# Patient Record
Sex: Male | Born: 1955
Health system: Southern US, Community
[De-identification: ages and names within clinical notes are randomized; demographics above are authoritative.]

## PROBLEM LIST (undated history)

## (undated) DIAGNOSIS — N189 Chronic kidney disease, unspecified: Secondary | ICD-10-CM

## (undated) DIAGNOSIS — J309 Allergic rhinitis, unspecified: Secondary | ICD-10-CM

## (undated) DIAGNOSIS — L708 Other acne: Secondary | ICD-10-CM

## (undated) DIAGNOSIS — Z87442 Personal history of urinary calculi: Secondary | ICD-10-CM

## (undated) DIAGNOSIS — I10 Essential (primary) hypertension: Secondary | ICD-10-CM

## (undated) DIAGNOSIS — Z86010 Personal history of colonic polyps: Secondary | ICD-10-CM

## (undated) DIAGNOSIS — E785 Hyperlipidemia, unspecified: Secondary | ICD-10-CM

## (undated) DIAGNOSIS — F528 Other sexual dysfunction not due to a substance or known physiological condition: Secondary | ICD-10-CM

## (undated) DIAGNOSIS — I4892 Unspecified atrial flutter: Secondary | ICD-10-CM

## (undated) DIAGNOSIS — R972 Elevated prostate specific antigen [PSA]: Secondary | ICD-10-CM

## (undated) HISTORY — DX: Other sexual dysfunction not due to a substance or known physiological condition: F52.8

## (undated) HISTORY — PX: POLYPECTOMY: SHX149

## (undated) HISTORY — PX: TONSILLECTOMY: SUR1361

## (undated) HISTORY — PX: COLONOSCOPY: SHX174

## (undated) HISTORY — DX: Allergic rhinitis, unspecified: J30.9

## (undated) HISTORY — DX: Essential (primary) hypertension: I10

## (undated) HISTORY — DX: Elevated prostate specific antigen (PSA): R97.20

## (undated) HISTORY — DX: Unspecified atrial flutter: I48.92

## (undated) HISTORY — DX: Other acne: L70.8

## (undated) HISTORY — DX: Personal history of colonic polyps: Z86.010

## (undated) HISTORY — DX: Hyperlipidemia, unspecified: E78.5

## (undated) HISTORY — DX: Personal history of urinary calculi: Z87.442

## (undated) HISTORY — DX: Chronic kidney disease, unspecified: N18.9

---

## 2003-07-26 ENCOUNTER — Emergency Department (HOSPITAL_COMMUNITY): Admission: EM | Admit: 2003-07-26 | Discharge: 2003-07-26 | Payer: Self-pay | Admitting: Emergency Medicine

## 2004-06-14 IMAGING — CT CT PELVIS W/O CM
1 of 3 series · 15 of 32 positions shown, 19 images · non-contrast
Comparison: none

CLINICAL DATA: Right lower abdominal and pelvic pain.  Nausea.  Previous history of renal calculi.
TECHNIQUE: Multidetector helical CT of the abdomen and pelvis was performed following the renal stone protocol.  No oral or intravenous contrast was administered.  There are no prior studies for comparison.
CT ABDOMEN WITHOUT CONTRAST
Multiple small renal calculi are seen in both the kidneys.  A fluid density cyst is also seen in the posterior mid pole of the right kidney measuring approximately 3 cm in greatest diameter.  Mild right sided pelvicalyectasis is seen as well as proximal ureterectasis.  There is no evidence of left sided pelvicalyectasis.  
The visualized portions of the upper abdominal parenchymal organs are unremarkable on this non contrast study.  There is no evidence of dilated bowel loops.  No inflammatory process or abnormal fluid collections are seen.  
IMPRESSION
1.  Mild right hydronephrosis.  See pelvis CT report below.
2.  Bilateral intrarenal calculi.  Small right renal cyst.
CT PELVIS WITHOUT CONTRAST
Right ureterectasis is seen.  Calculus is seen in the region of the right ureterovesical junction measuring 4 x 5 mm which may have already passed into the urinary bladder.  There is no evidence of left-sided ureteral calculi or dilatation.  
No pelvic masses are identified.  There is no evidence of inflammatory process or abnormal fluid collections of the pelvis.  There is no evidence of dilated bowel loops.  
4 x 5 mm calculus in the region of the right ureterovesical junction, which may have already passed into the urinary bladder.  Mild right hydronephrosis and ureterectasis.

[Series 3: kidney sto 5.0 b30f · axial · 0.66mm/px · z∈[+764,+1100]mm · 15 of 96 slices shown, 19 images]
[im 8/96  soft-tissue]
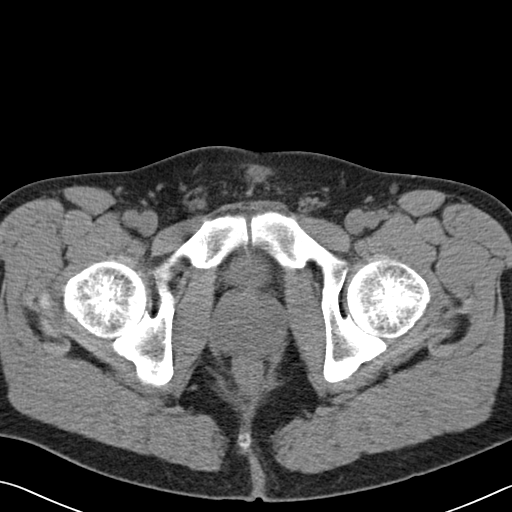
[im 8/96  bone]
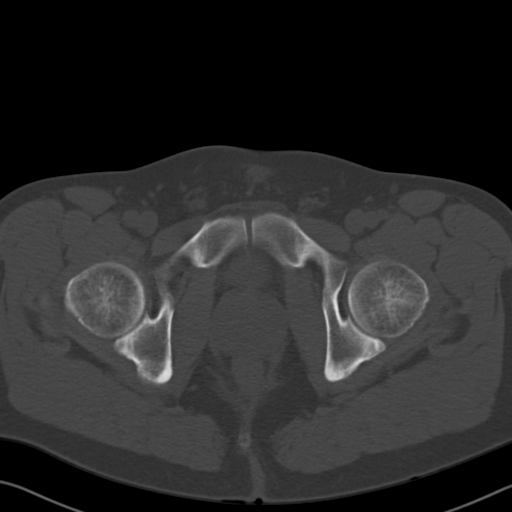
[im 15/96  soft-tissue]
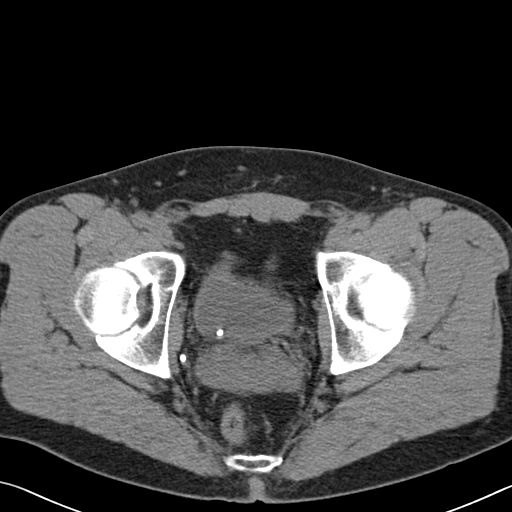
[im 22/96  soft-tissue]
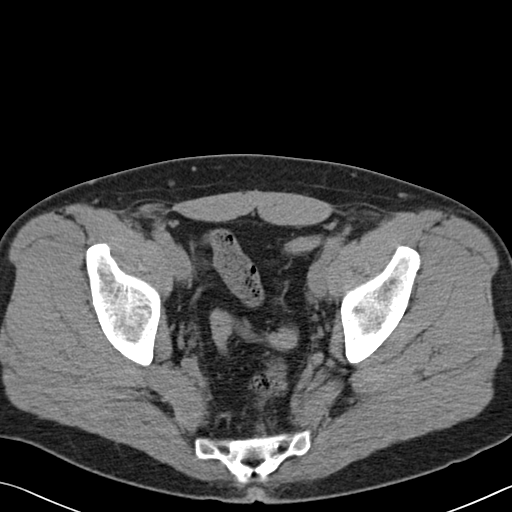
[im 29/96  soft-tissue]
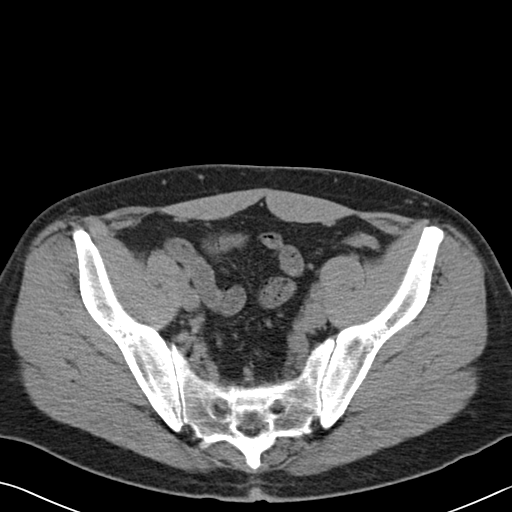
[im 36/96  soft-tissue]
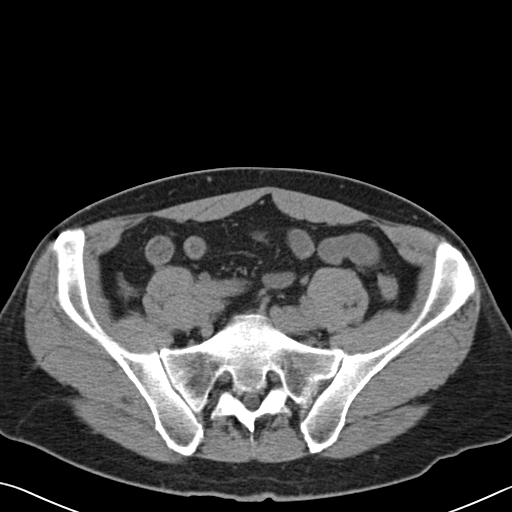
[im 43/96  soft-tissue]
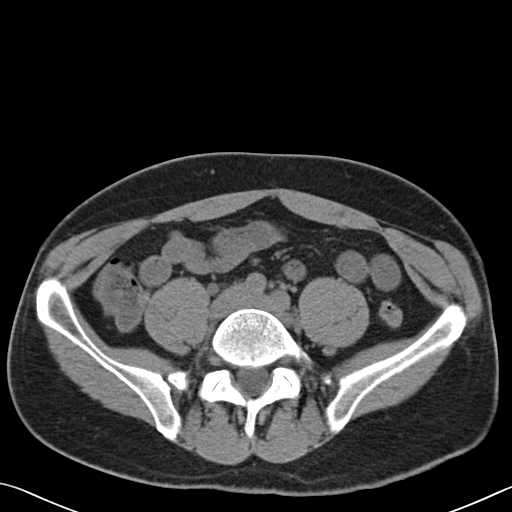
[im 50/96  soft-tissue]
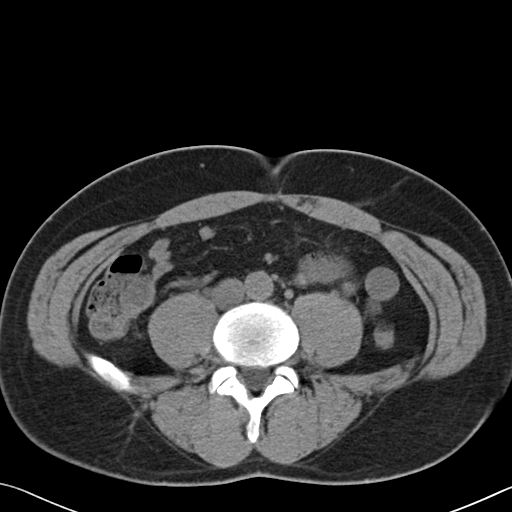
[im 57/96  soft-tissue]
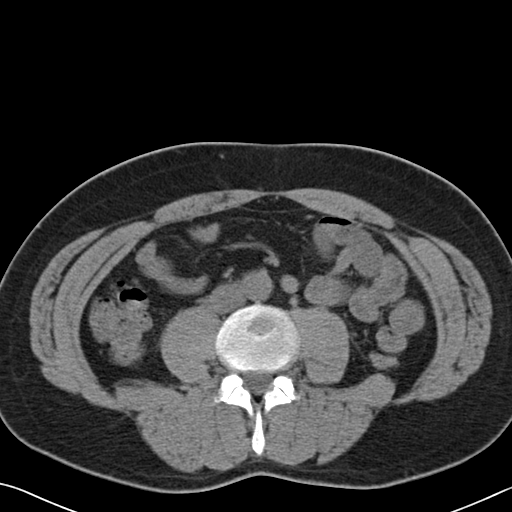
[im 64/96  soft-tissue]
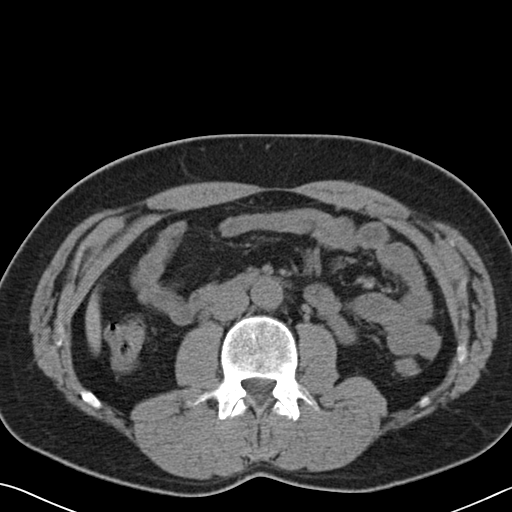
[im 64/96  bone]
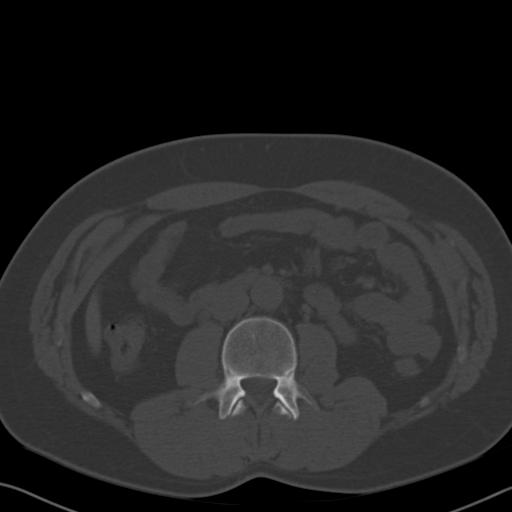
[im 71/96  soft-tissue]
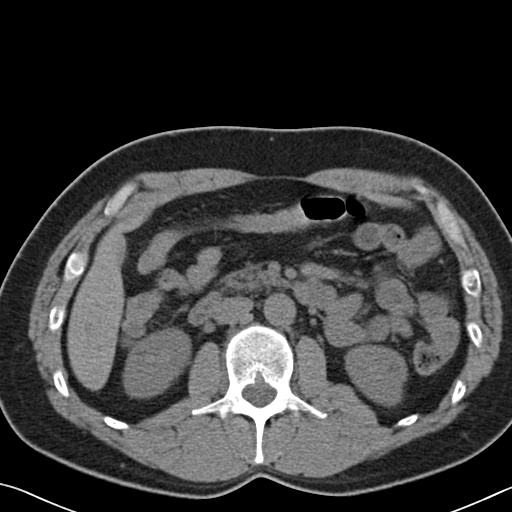
[im 78/96  soft-tissue]
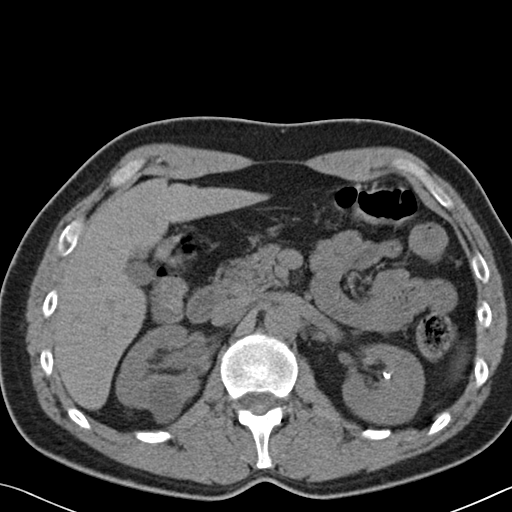
[im 81/96  lung]
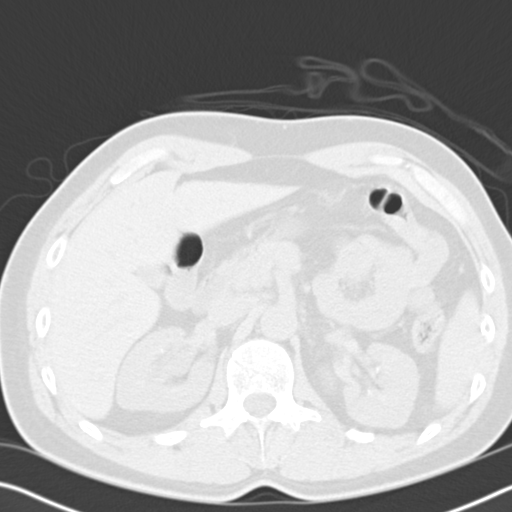
[im 85/96  soft-tissue]
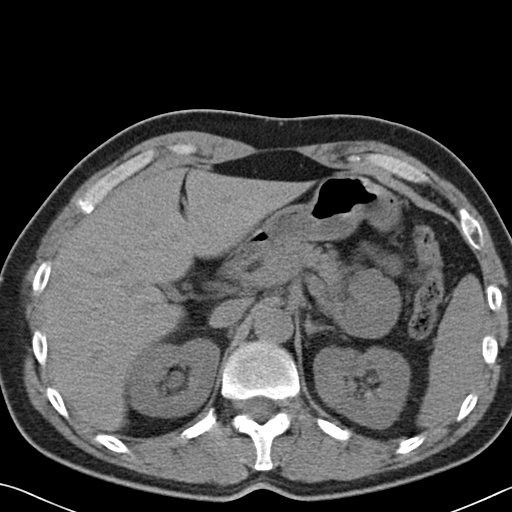
[im 85/96  lung]
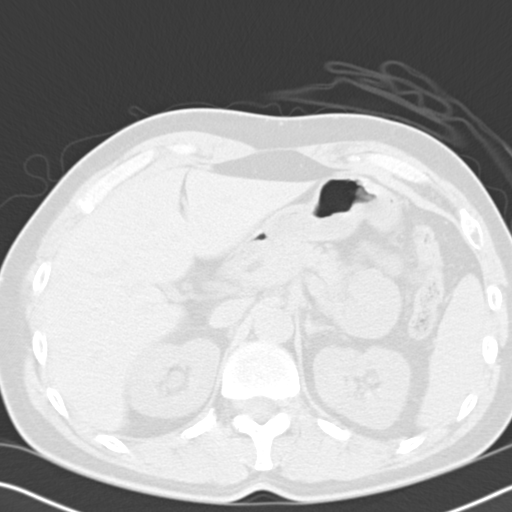
[im 88/96  lung]
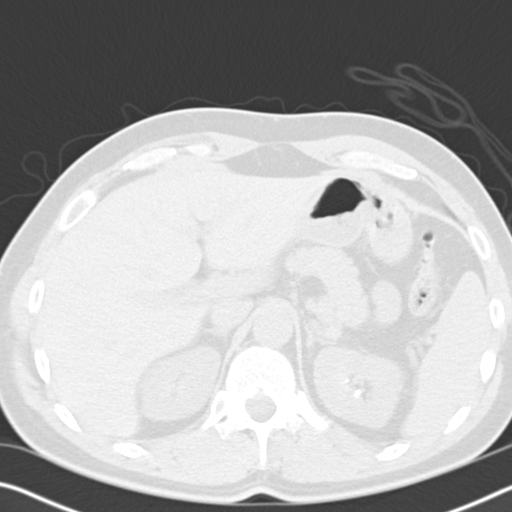
[im 92/96  soft-tissue]
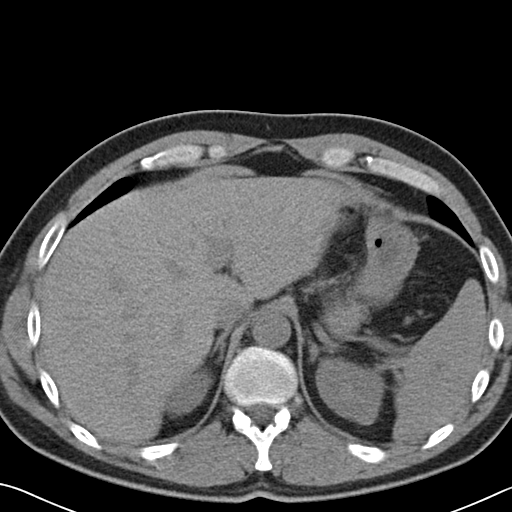
[im 92/96  lung]
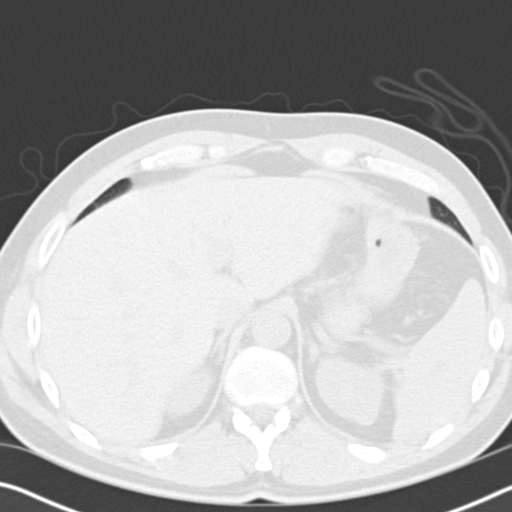

[15 of 32 positions shown; findings below may reference images not displayed]

## 2006-04-04 ENCOUNTER — Ambulatory Visit: Payer: Self-pay | Admitting: Internal Medicine

## 2006-04-09 ENCOUNTER — Ambulatory Visit: Payer: Self-pay | Admitting: Internal Medicine

## 2007-03-03 ENCOUNTER — Ambulatory Visit: Payer: Self-pay | Admitting: Internal Medicine

## 2007-03-03 LAB — CONVERTED CEMR LAB
ALT: 21 units/L (ref 0–53)
AST: 25 units/L (ref 0–37)
Albumin: 4.1 g/dL (ref 3.5–5.2)
Alkaline Phosphatase: 56 units/L (ref 39–117)
BUN: 18 mg/dL (ref 6–23)
Basophils Absolute: 0 10*3/uL (ref 0.0–0.1)
Basophils Relative: 0.3 % (ref 0.0–1.0)
Bilirubin Urine: NEGATIVE
Bilirubin, Direct: 0.2 mg/dL (ref 0.0–0.3)
CO2: 31 meq/L (ref 19–32)
Calcium: 9.1 mg/dL (ref 8.4–10.5)
Chloride: 108 meq/L (ref 96–112)
Cholesterol: 214 mg/dL (ref 0–200)
Creatinine, Ser: 1.2 mg/dL (ref 0.4–1.5)
Direct LDL: 154.1 mg/dL
Eosinophils Absolute: 0.2 10*3/uL (ref 0.0–0.6)
Eosinophils Relative: 2.7 % (ref 0.0–5.0)
GFR calc Af Amer: 82 mL/min
GFR calc non Af Amer: 68 mL/min
Glucose, Bld: 105 mg/dL — ABNORMAL HIGH (ref 70–99)
HCT: 44.1 % (ref 39.0–52.0)
HDL: 31.4 mg/dL — ABNORMAL LOW (ref >39.0)
Hemoglobin, Urine: NEGATIVE
Hemoglobin: 15.3 g/dL (ref 13.0–17.0)
Ketones, ur: NEGATIVE mg/dL
Leukocytes, UA: NEGATIVE
Lymphocytes Relative: 30.3 % (ref 12.0–46.0)
MCHC: 34.6 g/dL (ref 30.0–36.0)
MCV: 91.9 fL (ref 78.0–100.0)
Monocytes Absolute: 0.5 10*3/uL (ref 0.2–0.7)
Monocytes Relative: 9.8 % (ref 3.0–11.0)
Neutro Abs: 3.2 10*3/uL (ref 1.4–7.7)
Neutrophils Relative %: 56.9 % (ref 43.0–77.0)
Nitrite: NEGATIVE
PSA: 1.36 ng/mL (ref 0.10–4.00)
Platelets: 203 10*3/uL (ref 150–400)
Potassium: 4.4 meq/L (ref 3.5–5.1)
RBC: 4.79 M/uL (ref 4.22–5.81)
RDW: 12 % (ref 11.5–14.6)
Sodium: 143 meq/L (ref 135–145)
Specific Gravity, Urine: 1.025 (ref 1.000–1.03)
TSH: 1.35 microintl units/mL (ref 0.35–5.50)
Testosterone: 383.48 ng/dL (ref 350.00–890)
Total Bilirubin: 1.4 mg/dL — ABNORMAL HIGH (ref 0.3–1.2)
Total CHOL/HDL Ratio: 6.8
Total Protein, Urine: NEGATIVE mg/dL
Total Protein: 7 g/dL (ref 6.0–8.3)
Triglycerides: 99 mg/dL (ref 0–149)
Urine Glucose: NEGATIVE mg/dL
Urobilinogen, UA: 0.2 (ref 0.0–1.0)
VLDL: 20 mg/dL (ref 0–40)
WBC: 5.6 10*3/uL (ref 4.5–10.5)
pH: 6 (ref 5.0–8.0)

## 2007-03-09 ENCOUNTER — Ambulatory Visit: Payer: Self-pay | Admitting: Internal Medicine

## 2007-05-22 ENCOUNTER — Encounter: Payer: Self-pay | Admitting: Internal Medicine

## 2007-09-18 ENCOUNTER — Ambulatory Visit: Payer: Self-pay | Admitting: Gastroenterology

## 2007-10-02 ENCOUNTER — Encounter: Payer: Self-pay | Admitting: Internal Medicine

## 2007-10-02 ENCOUNTER — Encounter: Payer: Self-pay | Admitting: Gastroenterology

## 2007-10-02 ENCOUNTER — Ambulatory Visit: Payer: Self-pay | Admitting: Gastroenterology

## 2007-10-02 LAB — HM COLONOSCOPY

## 2008-06-16 ENCOUNTER — Telehealth: Payer: Self-pay | Admitting: Internal Medicine

## 2008-07-22 ENCOUNTER — Ambulatory Visit: Payer: Self-pay | Admitting: Internal Medicine

## 2008-07-22 LAB — CONVERTED CEMR LAB
ALT: 28 units/L (ref 0–53)
AST: 23 units/L (ref 0–37)
Albumin: 4.1 g/dL (ref 3.5–5.2)
Alkaline Phosphatase: 63 units/L (ref 39–117)
BUN: 22 mg/dL (ref 6–23)
Basophils Absolute: 0 10*3/uL (ref 0.0–0.1)
Basophils Relative: 0.7 % (ref 0.0–3.0)
Bilirubin Urine: NEGATIVE
Bilirubin, Direct: 0.1 mg/dL (ref 0.0–0.3)
CO2: 33 meq/L — ABNORMAL HIGH (ref 19–32)
Calcium: 9.3 mg/dL (ref 8.4–10.5)
Chloride: 107 meq/L (ref 96–112)
Cholesterol: 237 mg/dL (ref 0–200)
Creatinine, Ser: 1.1 mg/dL (ref 0.4–1.5)
Direct LDL: 167 mg/dL
Eosinophils Absolute: 0.1 10*3/uL (ref 0.0–0.7)
Eosinophils Relative: 2.9 % (ref 0.0–5.0)
GFR calc Af Amer: 91 mL/min
GFR calc non Af Amer: 75 mL/min
Glucose, Bld: 97 mg/dL (ref 70–99)
HCT: 43.7 % (ref 39.0–52.0)
HDL: 41.2 mg/dL (ref >39.0)
Hemoglobin, Urine: NEGATIVE
Hemoglobin: 15.4 g/dL (ref 13.0–17.0)
Ketones, ur: NEGATIVE mg/dL
Leukocytes, UA: NEGATIVE
Lymphocytes Relative: 28.2 % (ref 12.0–46.0)
MCHC: 35.2 g/dL (ref 30.0–36.0)
MCV: 93.8 fL (ref 78.0–100.0)
Monocytes Absolute: 0.5 10*3/uL (ref 0.1–1.0)
Monocytes Relative: 10.1 % (ref 3.0–12.0)
Neutro Abs: 2.8 10*3/uL (ref 1.4–7.7)
Neutrophils Relative %: 58.1 % (ref 43.0–77.0)
Nitrite: NEGATIVE
PSA: 1.17 ng/mL (ref 0.10–4.00)
Platelets: 166 10*3/uL (ref 150–400)
Potassium: 4.8 meq/L (ref 3.5–5.1)
RBC: 4.66 M/uL (ref 4.22–5.81)
RDW: 11.9 % (ref 11.5–14.6)
Sodium: 143 meq/L (ref 135–145)
Specific Gravity, Urine: 1.025 (ref 1.000–1.03)
TSH: 1.3 microintl units/mL (ref 0.35–5.50)
Total Bilirubin: 1 mg/dL (ref 0.3–1.2)
Total CHOL/HDL Ratio: 5.8
Total Protein: 6.8 g/dL (ref 6.0–8.3)
Triglycerides: 101 mg/dL (ref 0–149)
Urine Glucose: NEGATIVE mg/dL
Urobilinogen, UA: 0.2 (ref 0.0–1.0)
VLDL: 20 mg/dL (ref 0–40)
WBC: 4.8 10*3/uL (ref 4.5–10.5)
pH: 5.5 (ref 5.0–8.0)

## 2008-07-29 ENCOUNTER — Ambulatory Visit: Payer: Self-pay | Admitting: Internal Medicine

## 2008-07-29 DIAGNOSIS — F528 Other sexual dysfunction not due to a substance or known physiological condition: Secondary | ICD-10-CM | POA: Insufficient documentation

## 2008-07-29 DIAGNOSIS — Z87442 Personal history of urinary calculi: Secondary | ICD-10-CM | POA: Insufficient documentation

## 2008-07-29 DIAGNOSIS — Z860100 Personal history of colon polyps, unspecified: Secondary | ICD-10-CM | POA: Insufficient documentation

## 2008-07-29 DIAGNOSIS — E785 Hyperlipidemia, unspecified: Secondary | ICD-10-CM

## 2008-07-29 DIAGNOSIS — R972 Elevated prostate specific antigen [PSA]: Secondary | ICD-10-CM | POA: Insufficient documentation

## 2008-07-29 DIAGNOSIS — L708 Other acne: Secondary | ICD-10-CM

## 2008-07-29 DIAGNOSIS — I10 Essential (primary) hypertension: Secondary | ICD-10-CM | POA: Insufficient documentation

## 2008-07-29 DIAGNOSIS — J309 Allergic rhinitis, unspecified: Secondary | ICD-10-CM | POA: Insufficient documentation

## 2008-07-29 HISTORY — DX: Personal history of urinary calculi: Z87.442

## 2008-07-29 HISTORY — DX: Essential (primary) hypertension: I10

## 2008-07-29 HISTORY — DX: Personal history of colonic polyps: Z86.010

## 2008-07-29 HISTORY — DX: Allergic rhinitis, unspecified: J30.9

## 2008-07-29 HISTORY — DX: Other acne: L70.8

## 2008-07-29 HISTORY — DX: Elevated prostate specific antigen (PSA): R97.20

## 2008-07-29 HISTORY — DX: Hyperlipidemia, unspecified: E78.5

## 2008-07-29 HISTORY — DX: Other sexual dysfunction not due to a substance or known physiological condition: F52.8

## 2009-09-13 ENCOUNTER — Ambulatory Visit: Payer: Self-pay | Admitting: Internal Medicine

## 2009-09-13 LAB — CONVERTED CEMR LAB
ALT: 27 units/L (ref 0–53)
AST: 24 units/L (ref 0–37)
Albumin: 4 g/dL (ref 3.5–5.2)
Alkaline Phosphatase: 66 units/L (ref 39–117)
BUN: 18 mg/dL (ref 6–23)
Basophils Absolute: 0 10*3/uL (ref 0.0–0.1)
Basophils Relative: 0.6 % (ref 0.0–3.0)
Bilirubin Urine: NEGATIVE
Bilirubin, Direct: 0.2 mg/dL (ref 0.0–0.3)
CO2: 31 meq/L (ref 19–32)
Calcium: 9 mg/dL (ref 8.4–10.5)
Chloride: 106 meq/L (ref 96–112)
Cholesterol: 225 mg/dL — ABNORMAL HIGH (ref 0–200)
Creatinine, Ser: 1.2 mg/dL (ref 0.4–1.5)
Direct LDL: 164 mg/dL
Eosinophils Absolute: 0.1 10*3/uL (ref 0.0–0.7)
Eosinophils Relative: 2.7 % (ref 0.0–5.0)
GFR calc non Af Amer: 67.37 mL/min (ref >60)
Glucose, Bld: 78 mg/dL (ref 70–99)
HCT: 43.6 % (ref 39.0–52.0)
HDL: 46 mg/dL (ref >39.00)
Hemoglobin, Urine: NEGATIVE
Hemoglobin: 14.4 g/dL (ref 13.0–17.0)
Ketones, ur: NEGATIVE mg/dL
Leukocytes, UA: NEGATIVE
Lymphocytes Relative: 30 % (ref 12.0–46.0)
Lymphs Abs: 1.7 10*3/uL (ref 0.7–4.0)
MCHC: 32.9 g/dL (ref 30.0–36.0)
MCV: 95.8 fL (ref 78.0–100.0)
Monocytes Absolute: 0.7 10*3/uL (ref 0.1–1.0)
Monocytes Relative: 12.5 % — ABNORMAL HIGH (ref 3.0–12.0)
Neutro Abs: 3 10*3/uL (ref 1.4–7.7)
Neutrophils Relative %: 54.2 % (ref 43.0–77.0)
Nitrite: NEGATIVE
PSA: 1.17 ng/mL (ref 0.10–4.00)
Platelets: 178 10*3/uL (ref 150.0–400.0)
Potassium: 3.9 meq/L (ref 3.5–5.1)
RBC: 4.55 M/uL (ref 4.22–5.81)
RDW: 12.4 % (ref 11.5–14.6)
Sodium: 142 meq/L (ref 135–145)
Specific Gravity, Urine: >=1.03 (ref 1.000–1.030)
TSH: 1.41 microintl units/mL (ref 0.35–5.50)
Total Bilirubin: 1.1 mg/dL (ref 0.3–1.2)
Total CHOL/HDL Ratio: 5
Total Protein: 7.1 g/dL (ref 6.0–8.3)
Triglycerides: 83 mg/dL (ref 0.0–149.0)
Urine Glucose: NEGATIVE mg/dL
Urobilinogen, UA: 0.2 (ref 0.0–1.0)
VLDL: 16.6 mg/dL (ref 0.0–40.0)
WBC: 5.5 10*3/uL (ref 4.5–10.5)
pH: 5.5 (ref 5.0–8.0)

## 2009-09-18 ENCOUNTER — Ambulatory Visit: Payer: Self-pay | Admitting: Internal Medicine

## 2010-08-14 NOTE — Assessment & Plan Note (Signed)
Summary: PHYSICAL--STC   Vital Signs:  Patient profile:   55 year old male Height:      75 inches Weight:      208.75 pounds BMI:     26.19 O2 Sat:      96 % on Room air Temp:     98.3 degrees F oral Pulse rate:   64 / minute BP sitting:   132 / 80  (left arm) Cuff size:   regular  Vitals Entered ByZella Ball Ewing (September 18, 2009 10:49 AM)  O2 Flow:  Room air  CC: Adult Physical/RE   CC:  Adult Physical/RE.  History of Present Illness: overall doing well, but has some hesitancy with urination but mild overall and he minimizes, does not think significant overall at this time or need to try med such as flomax;  Pt denies CP, sob, doe, wheezing, orthopnea, pnd, worsening LE edema, palps, dizziness or syncope   Pt denies new neuro symptoms such as headache, facial or extremity weakness     Problems Prior to Update: 1)  Allergic Rhinitis  (ICD-477.9) 2)  Nephrolithiasis, Hx of  (ICD-V13.01) 3)  Erectile Dysfunction  (ICD-302.72) 4)  Psa, Increased  (ICD-790.93) 5)  Acne Nec  (ICD-706.1) 6)  Colonic Polyps, Hx of  (ICD-V12.72) 7)  Hypertension  (ICD-401.9) 8)  Hyperlipidemia  (ICD-272.4) 9)  Preventive Health Care  (ICD-V70.0)  Medications Prior to Update: 1)  Lotrel 5-20 Mg Caps (Amlodipine Besy-Benazepril Hcl) .Marland Kitchen.. 1 By Mouth Daily 2)  Adult Aspirin Ec Low Strength 81 Mg Tbec (Aspirin) .Marland Kitchen.. 1po Once Daily 3)  Cialis 20 Mg Tabs (Tadalafil) .Marland Kitchen.. 1 By Mouth Every Other Day  Current Medications (verified): 1)  Amlodipine Besy-Benazepril Hcl 5-20 Mg Caps (Amlodipine Besy-Benazepril Hcl) .Marland Kitchen.. 1po Once Daily 2)  Adult Aspirin Ec Low Strength 81 Mg Tbec (Aspirin) .Marland Kitchen.. 1po Once Daily  Allergies (verified): No Known Drug Allergies  Past History:  Past Medical History: Last updated: 2008/08/02 Hyperlipidemia Hypertension Colonic polyps, hx of - adenoma 3/09 PSA increased E.D. Nephrolithiasis, hx of Allergic rhinitis  Past Surgical History: Last updated:  2008-08-02 Tonsillectomy  Family History: Last updated: August 02, 2008 father died with CVA mother died with CVA and lung cancer  Social History: Last updated: 08-02-08 Never Smoked Alcohol use-yes Married 3 children work - Art therapist - [employer]  Risk Factors: Smoking Status: never (2008/08/02)  Review of Systems  The patient denies anorexia, fever, vision loss, decreased hearing, hoarseness, chest pain, syncope, dyspnea on exertion, peripheral edema, prolonged cough, headaches, hemoptysis, abdominal pain, melena, hematochezia, severe indigestion/heartburn, hematuria, incontinence, muscle weakness, suspicious skin lesions, difficulty walking, depression, unusual weight change, abnormal bleeding, enlarged lymph nodes, and angioedema.         all otherwise negative per pt -    Physical Exam  General:  alert and well-developed.   Head:  normocephalic and atraumatic.   Eyes:  vision grossly intact, pupils equal, and pupils round.   Ears:  R ear normal and L ear normal.   Nose:  no external deformity and no nasal discharge.   Mouth:  no gingival abnormalities and pharynx pink and moist.   Neck:  supple and no masses.   Lungs:  normal respiratory effort and normal breath sounds.   Heart:  normal rate and regular rhythm.   Abdomen:  soft, non-tender, and normal bowel sounds.   Msk:  no joint tenderness and no joint swelling.   Extremities:  no edema, no erythema  Neurologic:  cranial  nerves II-XII intact and strength normal in all extremities.   Skin:  color normal and no rashes.   Psych:  not anxious appearing and not depressed appearing.     Impression & Recommendations:  Problem # 1:  Preventive Health Care (ICD-V70.0) Overall doing well, age appropriate education and counseling updated and referral for appropriate preventive services done unless declined, immunizations up to date or declined, diet counseling done if overweight, urged to quit smoking if smokes  , most recent labs reviewed and current ordered if appropriate, ecg reviewed or declined (interpretation per ECG scanned in the EMR if done); information regarding Medicare Prevention requirements given if appropriate   Problem # 2:  HYPERLIPIDEMIA (ICD-272.4)  Labs Reviewed: SGOT: 24 (09/13/2009)   SGPT: 27 (09/13/2009)   HDL:46.00 (09/13/2009), 41.2 (07/22/2008)  LDL:DEL (07/22/2008), DEL (03/03/2007)  Chol:225 (09/13/2009), 237 (07/22/2008)  Trig:83.0 (09/13/2009), 101 (07/22/2008) d/w pt - to cont diet for now, consider statin but wants to re-check lipids 6 mo  Complete Medication List: 1)  Amlodipine Besy-benazepril Hcl 5-20 Mg Caps (Amlodipine besy-benazepril hcl) .Marland Kitchen.. 1po once daily 2)  Adult Aspirin Ec Low Strength 81 Mg Tbec (Aspirin) .Marland Kitchen.. 1po once daily  Other Orders: Tdap => 53yrs IM (47829) Admin 1st Vaccine (56213)  Patient Instructions: 1)  please return in 6 months for LAB only:  Lipids 272.0 2)  please call the number on the blue card for results 3)  you had the tetanus shot today 4)  Continue all previous medications as before this visit  5)  Please schedule a follow-up appointment in 1 year or sooner if needed Prescriptions: AMLODIPINE BESY-BENAZEPRIL HCL 5-20 MG CAPS (AMLODIPINE BESY-BENAZEPRIL HCL) 1po once daily  #90 x 3   Entered and Authorized by:   Corwin Levins MD   Signed by:   Corwin Levins MD on 09/18/2009   Method used:   Faxed to ...       [facility])             , [place]         Ph:        Fax: [phone]   RxID:   [id number]    Immunizations Administered:  Tetanus Vaccine:    Vaccine Type: Tdap    Site: left deltoid    Mfr: GlaxoSmithKline    Dose: 0.5 ml    Route: IM    Given by: Robin Ewing    Exp. Date: 05/10/2010    Lot #: [id number]    VIS given: 06/02/07 version given September 18, 2009.

## 2010-08-14 NOTE — Procedures (Signed)
Summary: Referral Form  Referral Form   Imported By: Maryln Gottron 05/25/2007 14:46:21  _____________________________________________________________________  External Attachment:    Type:   Image     Comment:   External Document

## 2010-08-14 NOTE — Procedures (Signed)
Summary: Colonoscopy Report/[facility]  Colonoscopy Report/[facility]   Imported By: Esmeralda Links D'jimraou 10/09/2007 12:30:52  _____________________________________________________________________  External Attachment:    Type:   Image     Comment:   External Document

## 2010-08-14 NOTE — Assessment & Plan Note (Signed)
Summary: PER PT PHYSICAL-$50--STC   Vital Signs:  Patient Profile:   55 Years Old Male Weight:      207 pounds O2 Sat:      97 % O2 treatment:    Room Air Temp:     97.0 degrees F oral Pulse rate:   89 / minute BP sitting:   116 / 80  (left arm) Cuff size:   regular  Vitals Entered By: Payton Spark CMA (July 29, 2008 1:07 PM)                 Preventive Care Screening  Colonoscopy:    Date:  10/02/2007    Next Due:  10/2012    Results:  Adenomatous Polyp    Chief Complaint:  physical.  History of Present Illness: overall doing well, has some off and on trouble with urinating very mild at best without dysuria or fever    Prior Medications Reviewed Using: Patient Recall  Updated Prior Medication List: LOTREL 5-20 MG CAPS (AMLODIPINE BESY-BENAZEPRIL HCL) 1 by mouth daily ADULT ASPIRIN EC LOW STRENGTH 81 MG TBEC (ASPIRIN) 1po once daily CIALIS 20 MG TABS (TADALAFIL) 1 by mouth every other day  Current Allergies (reviewed today): No known allergies   Past Medical History:    Reviewed history and no changes required:       Hyperlipidemia       Hypertension       Colonic polyps, hx of - adenoma 3/09       PSA increased       E.D.       Nephrolithiasis, hx of       Allergic rhinitis  Past Surgical History:    Reviewed history and no changes required:       Tonsillectomy   Family History:    Reviewed history and no changes required:       father died with CVA       mother died with CVA and lung cancer  Social History:    Reviewed history and no changes required:       Never Smoked       Alcohol use-yes       Married       3 children       work - Art therapist - local [employer]   Risk Factors:  Tobacco use:  never Alcohol use:  yes  Colonoscopy History:     Date of Last Colonoscopy:  10/02/2007    Results:  Adenomatous Polyp    Review of Systems  The patient denies anorexia, fever, weight loss, weight gain, vision loss, decreased  hearing, hoarseness, chest pain, syncope, dyspnea on exertion, peripheral edema, prolonged cough, headaches, hemoptysis, abdominal pain, melena, hematochezia, severe indigestion/heartburn, hematuria, incontinence, genital sores, muscle weakness, suspicious skin lesions, transient blindness, difficulty walking, depression, unusual weight change, abnormal bleeding, enlarged lymph nodes, angioedema, breast masses, and testicular masses.         all otherwise negative except would like to try another med for ED besides viagra - seems to cause heartburn   Physical Exam  General:     alert and well-developed.   Head:     Normocephalic and atraumatic without obvious abnormalities. No apparent alopecia or balding. Eyes:     No corneal or conjunctival inflammation noted. EOMI. Perrla. Ears:     R ear normal and L ear normal.   Nose:     nasal dischargemucosal pallor and mucosal erythema.   Mouth:  pharyngeal erythema and fair dentition.   Neck:     supple and full ROM.   Lungs:     Normal respiratory effort, chest expands symmetrically. Lungs are clear to auscultation, no crackles or wheezes. Heart:     Normal rate and regular rhythm. S1 and S2 normal without gallop, murmur, click, rub or other extra sounds. Abdomen:     Bowel sounds positive,abdomen soft and non-tender without masses, organomegaly or hernias noted. Rectal:     deferred Msk:     no joint tenderness and no joint swelling.   Extremities:     no edema, no ulcers  Neurologic:     cranial nerves II-XII intact and strength normal in all extremities.      Impression & Recommendations:  Problem # 1:  Preventive Health Care (ICD-V70.0) Overall doing well, up to date, counseled on routine health concerns for screening and prevention, immunizations up to date or declined, labs reviewed, ecg reviewed    Orders: EKG w/ Interpretation (93000)   Problem # 2:  HYPERTENSION (ICD-401.9)  His updated medication list for this  problem includes:    Lotrel 5-20 Mg Caps (Amlodipine besy-benazepril hcl) .Marland Kitchen... 1 by mouth daily  BP today: 116/80  Labs Reviewed: Creat: 1.1 (07/22/2008) Chol: 237 (07/22/2008)   HDL: 41.2 (07/22/2008)   LDL: 167.0 (07/22/2008)   TG: 101 (07/22/2008) stable overall by hx and exam, ok to continue meds/tx as is   Problem # 3:  HYPERLIPIDEMIA (ICD-272.4)  Labs Reviewed: Chol: 237 (07/22/2008)   HDL: 41.2 (07/22/2008)   LDL: 167.0 (07/22/2008)   TG: 101 (07/22/2008) SGOT: 23 (07/22/2008)   SGPT: 28 (07/22/2008) declines statin tx despite ldl goal < 100  Problem # 4:  ERECTILE DYSFUNCTION (ICD-302.72)  His updated medication list for this problem includes:    Cialis 20 Mg Tabs (Tadalafil) .Marland Kitchen... 1 by mouth every other day treat as above, f/u any worsening signs or symptoms   Complete Medication List: 1)  Lotrel 5-20 Mg Caps (Amlodipine besy-benazepril hcl) .Marland Kitchen.. 1 by mouth daily 2)  Adult Aspirin Ec Low Strength 81 Mg Tbec (Aspirin) .Marland Kitchen.. 1po once daily 3)  Cialis 20 Mg Tabs (Tadalafil) .Marland Kitchen.. 1 by mouth every other day   Patient Instructions: 1)  please follow strict lower cholesterol diet 2)  you received the tetanus today 3)  use the cialis as needed 4)  Continue all medications that you may have been taking previously  5)  Please schedule a follow-up appointment in 1 year or sooner if needed   Prescriptions: CIALIS 20 MG TABS (TADALAFIL) 1 by mouth every other day  #5 x 11   Entered and Authorized by:   Corwin Levins MD   Signed by:   Corwin Levins MD on 07/29/2008   Method used:   Print then Give to Patient   RxID:   [id number] CIALIS 20 MG TABS (TADALAFIL) 1 by mouth every other day  #3 x 0   Entered and Authorized by:   Corwin Levins MD   Signed by:   Corwin Levins MD on 07/29/2008   Method used:   Print then Give to Patient   RxID:   [id number] LOTREL 5-20 MG CAPS (AMLODIPINE BESY-BENAZEPRIL HCL) 1 by mouth daily  #90 x 3   Entered and Authorized by:   Corwin Levins MD   Signed by:   Corwin Levins MD on 07/29/2008   Method used:   Print then Give to Patient  RxID:   [id number]

## 2010-10-09 ENCOUNTER — Other Ambulatory Visit (INDEPENDENT_AMBULATORY_CARE_PROVIDER_SITE_OTHER): Payer: Managed Care, Other (non HMO)

## 2010-10-09 DIAGNOSIS — E785 Hyperlipidemia, unspecified: Secondary | ICD-10-CM

## 2010-10-09 DIAGNOSIS — Z0000 Encounter for general adult medical examination without abnormal findings: Secondary | ICD-10-CM

## 2010-10-09 DIAGNOSIS — Z0389 Encounter for observation for other suspected diseases and conditions ruled out: Secondary | ICD-10-CM

## 2010-10-09 LAB — URINALYSIS, ROUTINE W REFLEX MICROSCOPIC
Bilirubin Urine: NEGATIVE
Ketones, ur: NEGATIVE
Leukocytes, UA: NEGATIVE
Nitrite: NEGATIVE
Specific Gravity, Urine: 1.025 (ref 1.000–1.030)
Total Protein, Urine: NEGATIVE
Urine Glucose: NEGATIVE
Urobilinogen, UA: 0.2 (ref 0.0–1.0)
pH: 5.5 (ref 5.0–8.0)

## 2010-10-09 LAB — BASIC METABOLIC PANEL
BUN: 22 mg/dL (ref 6–23)
CO2: 30 mEq/L (ref 19–32)
Calcium: 8.9 mg/dL (ref 8.4–10.5)
Chloride: 107 mEq/L (ref 96–112)
Creatinine, Ser: 1.1 mg/dL (ref 0.4–1.5)
GFR: 73.41 mL/min (ref >60.00)
Glucose, Bld: 82 mg/dL (ref 70–99)
Potassium: 4.2 mEq/L (ref 3.5–5.1)
Sodium: 142 mEq/L (ref 135–145)

## 2010-10-09 LAB — CBC WITH DIFFERENTIAL/PLATELET
Basophils Absolute: 0 10*3/uL (ref 0.0–0.1)
Basophils Relative: 0.5 % (ref 0.0–3.0)
Eosinophils Absolute: 0.1 10*3/uL (ref 0.0–0.7)
Eosinophils Relative: 1.7 % (ref 0.0–5.0)
HCT: 41.6 % (ref 39.0–52.0)
Hemoglobin: 14 g/dL (ref 13.0–17.0)
Lymphocytes Relative: 24.9 % (ref 12.0–46.0)
Lymphs Abs: 1.3 10*3/uL (ref 0.7–4.0)
MCHC: 33.6 g/dL (ref 30.0–36.0)
MCV: 94.6 fl (ref 78.0–100.0)
Monocytes Absolute: 0.5 10*3/uL (ref 0.1–1.0)
Monocytes Relative: 9.8 % (ref 3.0–12.0)
Neutro Abs: 3.3 10*3/uL (ref 1.4–7.7)
Neutrophils Relative %: 63.1 % (ref 43.0–77.0)
Platelets: 184 10*3/uL (ref 150.0–400.0)
RBC: 4.4 Mil/uL (ref 4.22–5.81)
RDW: 13 % (ref 11.5–14.6)
WBC: 5.2 10*3/uL (ref 4.5–10.5)

## 2010-10-09 LAB — HEPATIC FUNCTION PANEL
ALT: 20 U/L (ref 0–53)
AST: 23 U/L (ref 0–37)
Albumin: 3.9 g/dL (ref 3.5–5.2)
Alkaline Phosphatase: 65 U/L (ref 39–117)
Bilirubin, Direct: 0.1 mg/dL (ref 0.0–0.3)
Total Bilirubin: 0.9 mg/dL (ref 0.3–1.2)
Total Protein: 6.5 g/dL (ref 6.0–8.3)

## 2010-10-09 LAB — LIPID PANEL
Cholesterol: 206 mg/dL — ABNORMAL HIGH (ref 0–200)
HDL: 38.3 mg/dL — ABNORMAL LOW (ref >39.00)
Total CHOL/HDL Ratio: 5
Triglycerides: 111 mg/dL (ref 0.0–149.0)
VLDL: 22.2 mg/dL (ref 0.0–40.0)

## 2010-10-09 LAB — TSH: TSH: 1.56 u[IU]/mL (ref 0.35–5.50)

## 2010-10-09 LAB — LDL CHOLESTEROL, DIRECT: Direct LDL: 144.7 mg/dL

## 2010-10-09 LAB — PSA: PSA: 1.13 ng/mL (ref 0.10–4.00)

## 2010-10-15 ENCOUNTER — Ambulatory Visit (INDEPENDENT_AMBULATORY_CARE_PROVIDER_SITE_OTHER): Payer: Managed Care, Other (non HMO) | Admitting: Internal Medicine

## 2010-10-15 ENCOUNTER — Encounter: Payer: Self-pay | Admitting: Internal Medicine

## 2010-10-15 VITALS — BP 124/78 | HR 67 | Temp 98.2°F | Ht 75.0 in | Wt 207.5 lb

## 2010-10-15 DIAGNOSIS — F528 Other sexual dysfunction not due to a substance or known physiological condition: Secondary | ICD-10-CM

## 2010-10-15 DIAGNOSIS — Z79899 Other long term (current) drug therapy: Secondary | ICD-10-CM

## 2010-10-15 DIAGNOSIS — E785 Hyperlipidemia, unspecified: Secondary | ICD-10-CM

## 2010-10-15 DIAGNOSIS — Z0000 Encounter for general adult medical examination without abnormal findings: Secondary | ICD-10-CM

## 2010-10-15 DIAGNOSIS — I10 Essential (primary) hypertension: Secondary | ICD-10-CM

## 2010-10-15 MED ORDER — ATORVASTATIN CALCIUM 10 MG PO TABS: 10.0000 mg | ORAL_TABLET | Freq: Every day | ORAL | Status: DC

## 2010-10-15 MED ORDER — VARDENAFIL HCL 20 MG PO TABS: 20.0000 mg | ORAL_TABLET | ORAL | Status: DC | PRN

## 2010-10-15 MED ORDER — AMLODIPINE BESY-BENAZEPRIL HCL 5-20 MG PO CAPS: 1.0000 | ORAL_CAPSULE | Freq: Every day | ORAL | Status: DC

## 2010-10-15 NOTE — Assessment & Plan Note (Signed)
For lft's with next lab visit

## 2010-10-15 NOTE — Assessment & Plan Note (Addendum)
Uncontrolled - to start the lipitor 10 mg, with f/u labs at 4 wks - last LDL 144, with goal ldl < 100

## 2010-10-15 NOTE — Patient Instructions (Signed)
Start the lipitor at 10 mg per day (sent to [facility]) Continue all other medications as before Please return for LAB only in 4 wks for Liver tests, and Lipids Please call the number on the Blue Card (the [employer] [facility]) for results of testing in 2-3 days after that Please call if you need a prescription to [facility] for the levitra or cialis

## 2010-10-15 NOTE — Assessment & Plan Note (Signed)
viagra with HA and back pain - gave 3 sample cialis, and rx for lesser expensive levitra 20 mg - pt to call if needs refills

## 2010-10-15 NOTE — Progress Notes (Signed)
Subjective:    Patient ID: Mathew Sanchez, male    DOB: [birth year], 55 y.o.   MRN: [id number]  HPI  Here for wellness and f/u;  Overall doing ok;  Pt denies CP, worsening SOB, DOE, wheezing, orthopnea, PND, worsening LE edema, palpitations, dizziness or syncope.  Pt denies neurological change such as new Headache, facial or extremity weakness.  Pt denies polydipsia, polyuria, or low sugar symptoms. Pt states overall good compliance with treatment and medications, good tolerability, and trying to follow lower cholesterol diet.  Pt denies worsening depressive symptoms, suicidal ideation or panic. No fever, wt loss, night sweats, loss of appetite, or other constitutional symptoms.  Pt states good ability with ADL's, low fall risk, home safety reviewed and adequate, no significant changes in hearing or vision, and occasionally active with exercise up to 3-4 times per wk.  Also menitons soreness and pain to the right retroauricular area upper aspect, sore to the touch, happened about 4 wks ago, lasted off adn on mild for about a wk, no fever, HA, ear pain, ST , cough.  Now resolved - ? msk strain Also mentions some minor urinary stream slowing but no other symptoms of prostatism at this time. Does have ongoing ED but viagra causea HA. Past Medical History  Diagnosis Date  . HYPERLIPIDEMIA 07/29/2008  . ERECTILE DYSFUNCTION 07/29/2008  . HYPERTENSION 07/29/2008  . ALLERGIC RHINITIS 07/29/2008  . ACNE NEC 07/29/2008  . PSA, INCREASED 07/29/2008  . COLONIC POLYPS, HX OF 07/29/2008  . NEPHROLITHIASIS, HX OF 07/29/2008   Past Surgical History  Procedure Date  . Tonsillectomy     reports that he has never smoked. He does not have any smokeless tobacco history on file. He reports that he drinks alcohol. His drug history not on file. family history is not on file. No Known Allergies Current Outpatient Prescriptions on File Prior to Visit  Medication Sig Dispense Refill  . aspirin 81 MG EC tablet Take 81 mg by  mouth daily.        Marland Kitchen DISCONTD: amLODipine-benazepril (LOTREL) 5-20 MG per capsule Take 1 capsule by mouth daily.         Review of Systems Review of Systems  Constitutional: Negative for diaphoresis, activity change, appetite change and unexpected weight change.  HENT: Negative for hearing loss, ear pain, facial swelling, mouth sores and neck stiffness.   Eyes: Negative for pain, redness and visual disturbance.  Respiratory: Negative for shortness of breath and wheezing.   Cardiovascular: Negative for chest pain and palpitations.  Gastrointestinal: Negative for diarrhea, blood in stool, abdominal distention and rectal pain.  Genitourinary: Negative for hematuria, flank pain and decreased urine volume.  Musculoskeletal: Negative for myalgias and joint swelling.  Skin: Negative for color change and wound.  Neurological: Negative for syncope and numbness.  Hematological: Negative for adenopathy.  Psychiatric/Behavioral: Negative for hallucinations, self-injury, decreased concentration and agitation.      Objective:   Physical Exam BP 124/78  Pulse 67  Temp(Src) 98.2 F (36.8 C) (Oral)  Ht 6\' 3"  (1.905 m)  Wt 207 lb 8 oz (94.121 kg)  BMI 25.94 kg/m2  SpO2 97%  Physical Exam  VS noted Constitutional: Pt is oriented to person, place, and time. Appears well-developed and well-nourished.  HENT:  Head: Normocephalic and atraumatic.  Right Ear: External ear normal.  Left Ear: External ear normal.  Nose: Nose normal.  Mouth/Throat: Oropharynx is clear and moist.  Eyes: Conjunctivae and EOM are normal. Pupils are equal, round, and reactive to  light.  Neck: Normal range of motion. Neck supple. No JVD present. No tracheal deviation present.  Cardiovascular: Normal rate, regular rhythm, normal heart sounds and intact distal pulses.   Pulmonary/Chest: Effort normal and breath sounds normal.  Abdominal: Soft. Bowel sounds are normal. There is no tenderness.  Musculoskeletal: Normal range of  motion. Exhibits no edema.  Lymphadenopathy:  Has no cervical adenopathy.  Neurological: Pt is alert and oriented to person, place, and time. Pt has normal reflexes. No cranial nerve deficit.  Skin: Skin is warm and dry. No rash noted.  Psychiatric:  Has  normal mood and affect. Behavior is normal.        Assessment & Plan:

## 2010-10-15 NOTE — Assessment & Plan Note (Signed)
stable overall by hx and exam, most recent lab reviewed with pt, and pt to continue medical treatment as before   Lab Results  Component Value Date   WBC 5.2 10/09/2010   HGB 14.0 10/09/2010   HCT 41.6 10/09/2010   PLT 184.0 10/09/2010   CHOL 206* 10/09/2010   TRIG 111.0 10/09/2010   HDL 38.30* 10/09/2010   LDLDIRECT 144.7 10/09/2010   ALT 20 10/09/2010   AST 23 10/09/2010   NA 142 10/09/2010   K 4.2 10/09/2010   CL 107 10/09/2010   CREATININE 1.1 10/09/2010   BUN 22 10/09/2010   CO2 30 10/09/2010   TSH 1.56 10/09/2010   PSA 1.13 10/09/2010

## 2011-06-20 ENCOUNTER — Ambulatory Visit: Payer: Self-pay

## 2012-01-27 ENCOUNTER — Telehealth: Payer: Self-pay

## 2012-01-27 DIAGNOSIS — Z0000 Encounter for general adult medical examination without abnormal findings: Secondary | ICD-10-CM

## 2012-01-27 NOTE — Telephone Encounter (Signed)
Put order in for physical labs. 

## 2012-02-28 ENCOUNTER — Other Ambulatory Visit (INDEPENDENT_AMBULATORY_CARE_PROVIDER_SITE_OTHER): Payer: Managed Care, Other (non HMO)

## 2012-02-28 DIAGNOSIS — Z0000 Encounter for general adult medical examination without abnormal findings: Secondary | ICD-10-CM

## 2012-02-28 LAB — URINALYSIS, ROUTINE W REFLEX MICROSCOPIC
Bilirubin Urine: NEGATIVE
Hgb urine dipstick: NEGATIVE
Ketones, ur: NEGATIVE
Leukocytes, UA: NEGATIVE
Nitrite: NEGATIVE
Specific Gravity, Urine: 1.025 (ref 1.000–1.030)
Total Protein, Urine: NEGATIVE
Urine Glucose: NEGATIVE
Urobilinogen, UA: 0.2 (ref 0.0–1.0)
pH: 5.5 (ref 5.0–8.0)

## 2012-02-28 LAB — CBC WITH DIFFERENTIAL/PLATELET
Basophils Absolute: 0 10*3/uL (ref 0.0–0.1)
Basophils Relative: 0.6 % (ref 0.0–3.0)
Eosinophils Absolute: 0.1 10*3/uL (ref 0.0–0.7)
Eosinophils Relative: 2 % (ref 0.0–5.0)
HCT: 42.6 % (ref 39.0–52.0)
Hemoglobin: 14.1 g/dL (ref 13.0–17.0)
Lymphocytes Relative: 25.4 % (ref 12.0–46.0)
Lymphs Abs: 1.4 10*3/uL (ref 0.7–4.0)
MCHC: 33 g/dL (ref 30.0–36.0)
MCV: 94 fl (ref 78.0–100.0)
Monocytes Absolute: 0.6 10*3/uL (ref 0.1–1.0)
Monocytes Relative: 10.4 % (ref 3.0–12.0)
Neutro Abs: 3.4 10*3/uL (ref 1.4–7.7)
Neutrophils Relative %: 61.6 % (ref 43.0–77.0)
Platelets: 177 10*3/uL (ref 150.0–400.0)
RBC: 4.53 Mil/uL (ref 4.22–5.81)
RDW: 12.5 % (ref 11.5–14.6)
WBC: 5.5 10*3/uL (ref 4.5–10.5)

## 2012-02-28 LAB — LIPID PANEL
Cholesterol: 133 mg/dL (ref 0–200)
HDL: 33.9 mg/dL — ABNORMAL LOW (ref >39.00)
LDL Cholesterol: 89 mg/dL (ref 0–99)
Total CHOL/HDL Ratio: 4
Triglycerides: 52 mg/dL (ref 0.0–149.0)
VLDL: 10.4 mg/dL (ref 0.0–40.0)

## 2012-02-28 LAB — HEPATIC FUNCTION PANEL
ALT: 21 U/L (ref 0–53)
AST: 27 U/L (ref 0–37)
Albumin: 4 g/dL (ref 3.5–5.2)
Alkaline Phosphatase: 67 U/L (ref 39–117)
Bilirubin, Direct: 0.1 mg/dL (ref 0.0–0.3)
Total Bilirubin: 0.8 mg/dL (ref 0.3–1.2)
Total Protein: 6.5 g/dL (ref 6.0–8.3)

## 2012-02-28 LAB — PSA: PSA: 1.4 ng/mL (ref 0.10–4.00)

## 2012-02-28 LAB — TSH: TSH: 1.84 u[IU]/mL (ref 0.35–5.50)

## 2012-02-28 LAB — BASIC METABOLIC PANEL
BUN: 20 mg/dL (ref 6–23)
CO2: 25 mEq/L (ref 19–32)
Calcium: 8.6 mg/dL (ref 8.4–10.5)
Chloride: 106 mEq/L (ref 96–112)
Creatinine, Ser: 1.1 mg/dL (ref 0.4–1.5)
GFR: 71.54 mL/min (ref >60.00)
Glucose, Bld: 84 mg/dL (ref 70–99)
Potassium: 4.1 mEq/L (ref 3.5–5.1)
Sodium: 141 mEq/L (ref 135–145)

## 2012-03-06 ENCOUNTER — Encounter: Payer: Self-pay | Admitting: Internal Medicine

## 2012-03-06 ENCOUNTER — Ambulatory Visit (INDEPENDENT_AMBULATORY_CARE_PROVIDER_SITE_OTHER): Payer: Managed Care, Other (non HMO) | Admitting: Internal Medicine

## 2012-03-06 VITALS — BP 130/82 | HR 71 | Temp 97.9°F | Ht 75.0 in | Wt 201.4 lb

## 2012-03-06 DIAGNOSIS — I10 Essential (primary) hypertension: Secondary | ICD-10-CM

## 2012-03-06 DIAGNOSIS — Z0001 Encounter for general adult medical examination with abnormal findings: Secondary | ICD-10-CM | POA: Insufficient documentation

## 2012-03-06 DIAGNOSIS — Z0000 Encounter for general adult medical examination without abnormal findings: Secondary | ICD-10-CM

## 2012-03-06 DIAGNOSIS — E785 Hyperlipidemia, unspecified: Secondary | ICD-10-CM

## 2012-03-06 MED ORDER — SILDENAFIL CITRATE 100 MG PO TABS: 50.0000 mg | ORAL_TABLET | Freq: Every day | ORAL | Status: DC | PRN

## 2012-03-06 MED ORDER — AMLODIPINE BESY-BENAZEPRIL HCL 5-20 MG PO CAPS: 1.0000 | ORAL_CAPSULE | Freq: Every day | ORAL | Status: DC

## 2012-03-06 MED ORDER — ATORVASTATIN CALCIUM 10 MG PO TABS: 10.0000 mg | ORAL_TABLET | Freq: Every day | ORAL | Status: DC

## 2012-03-06 NOTE — Progress Notes (Signed)
Subjective:    Patient ID: Mathew Sanchez, male    DOB: 1956-06-15, 56 y.o.   MRN: [id number]  HPI Here for wellness and f/u;  Overall doing ok;  Pt denies CP, worsening SOB, DOE, wheezing, orthopnea, PND, worsening LE edema, palpitations, dizziness or syncope.  Pt denies neurological change such as new Headache, facial or extremity weakness.  Pt denies polydipsia, polyuria, or low sugar symptoms. Pt states overall good compliance with treatment and medications, good tolerability, and trying to follow lower cholesterol diet.  Pt denies worsening depressive symptoms, suicidal ideation or panic. No fever, wt loss, night sweats, loss of appetite, or other constitutional symptoms.  Pt states good ability with ADL's, low fall risk, home safety reviewed and adequate, no significant changes in hearing or vision, and occasionally active with exercise.  Wt overall stable, has tried to be more physically active recently.  Does have occasional nocturia and slightly slowed stream, but minor per pt and does not want tx. Past Medical History  Diagnosis Date  . HYPERLIPIDEMIA 07/29/2008  . ERECTILE DYSFUNCTION 07/29/2008  . HYPERTENSION 07/29/2008  . ALLERGIC RHINITIS 07/29/2008  . ACNE NEC 07/29/2008  . PSA, INCREASED 07/29/2008  . COLONIC POLYPS, HX OF 07/29/2008  . NEPHROLITHIASIS, HX OF 07/29/2008   Past Surgical History  Procedure Date  . Tonsillectomy     reports that he has never smoked. He does not have any smokeless tobacco history on file. He reports that he drinks alcohol. His drug history not on file. family history is not on file. Allergies  Allergen Reactions  . Sildenafil Citrate    Current Outpatient Prescriptions on File Prior to Visit  Medication Sig Dispense Refill  . aspirin 81 MG EC tablet Take 81 mg by mouth daily.        Marland Kitchen DISCONTD: amLODipine-benazepril (LOTREL) 5-20 MG per capsule Take 1 capsule by mouth daily.  90 capsule  3  . sildenafil (VIAGRA) 100 MG tablet Take 0.5-1 tablets  (50-100 mg total) by mouth daily as needed for erectile dysfunction.  10 tablet  11  . vardenafil (LEVITRA) 20 MG tablet Take 1 tablet (20 mg total) by mouth as needed for erectile dysfunction.  10 tablet  5  . DISCONTD: atorvastatin (LIPITOR) 10 MG tablet Take 1 tablet (10 mg total) by mouth daily.  90 tablet  3   Review of Systems Review of Systems  Constitutional: Negative for diaphoresis, activity change, appetite change and unexpected weight change.  HENT: Negative for hearing loss, ear pain, facial swelling, mouth sores and neck stiffness.   Eyes: Negative for pain, redness and visual disturbance.  Respiratory: Negative for shortness of breath and wheezing.   Cardiovascular: Negative for chest pain and palpitations.  Gastrointestinal: Negative for diarrhea, blood in stool, abdominal distention and rectal pain.  Genitourinary: Negative for hematuria, flank pain and decreased urine volume.  Musculoskeletal: Negative for myalgias and joint swelling.  Skin: Negative for color change and wound.  Neurological: Negative for syncope and numbness.  Hematological: Negative for adenopathy.  Psychiatric/Behavioral: Negative for hallucinations, self-injury, decreased concentration and agitation.     Objective:   Physical Exam BP 130/82  Pulse 71  Temp 97.9 F (36.6 C) (Oral)  Ht 6\' 3"  (1.905 m)  Wt 201 lb 6 oz (91.343 kg)  BMI 25.17 kg/m2  SpO2 96% Physical Exam  VS noted Constitutional: Pt is oriented to person, place, and time. Appears well-developed and well-nourished.  HENT:  Head: Normocephalic and atraumatic.  Right Ear: External ear normal.  Left Ear: External ear normal.  Nose: Nose normal.  Mouth/Throat: Oropharynx is clear and moist.  Eyes: Conjunctivae and EOM are normal. Pupils are equal, round, and reactive to light.  Neck: Normal range of motion. Neck supple. No JVD present. No tracheal deviation present.  Cardiovascular: Normal rate, regular rhythm, normal heart sounds  and intact distal pulses.   Pulmonary/Chest: Effort normal and breath sounds normal.  Abdominal: Soft. Bowel sounds are normal. There is no tenderness.  Musculoskeletal: Normal range of motion. Exhibits no edema.  Lymphadenopathy:  Has no cervical adenopathy.  Neurological: Pt is alert and oriented to person, place, and time. Pt has normal reflexes. No cranial nerve deficit.  Skin: Skin is warm and dry. No rash noted.  Psychiatric:  Has  normal mood and affect. Behavior is normal.     Assessment & Plan:

## 2012-03-06 NOTE — Assessment & Plan Note (Signed)
stable overall by hx and exam, most recent data reviewed with pt, and pt to continue medical treatment as before Lab Results  Component Value Date   LDLCALC 89 02/28/2012

## 2012-03-06 NOTE — Assessment & Plan Note (Signed)

## 2012-03-06 NOTE — Patient Instructions (Addendum)
Take all new medications as prescribed Continue all other medications as before Your refills were done as requested You should get a notice or colonoscopy due for 2014 in the mail You are otherwise up to date with prevention Please return in 1 year for your yearly visit, or sooner if needed, with Lab testing done 3-5 days before

## 2012-03-06 NOTE — Assessment & Plan Note (Signed)
stable overall by hx and exam, most recent data reviewed with pt, and pt to continue medical treatment as before BP Readings from Last 3 Encounters:  03/06/12 130/82  10/15/10 124/78  09/18/09 132/80

## 2012-08-05 ENCOUNTER — Encounter: Payer: Self-pay | Admitting: Gastroenterology

## 2012-11-27 ENCOUNTER — Ambulatory Visit: Payer: Managed Care, Other (non HMO) | Admitting: Emergency Medicine

## 2012-11-27 VITALS — BP 124/72 | HR 57 | Temp 98.1°F | Resp 16 | Ht 76.0 in | Wt 202.0 lb

## 2012-11-27 DIAGNOSIS — T148 Other injury of unspecified body region: Secondary | ICD-10-CM

## 2012-11-27 DIAGNOSIS — W57XXXA Bitten or stung by nonvenomous insect and other nonvenomous arthropods, initial encounter: Secondary | ICD-10-CM

## 2012-11-27 MED ORDER — DOXYCYCLINE HYCLATE 100 MG PO CAPS: 100.0000 mg | ORAL_CAPSULE | Freq: Two times a day (BID) | ORAL | Status: DC

## 2012-11-27 NOTE — Progress Notes (Signed)
[facility] [address], [place] [address] [phone]  Date:  11/27/2012   Name:  Mathew Sanchez   DOB:  [birth year]   MRN:  [id number]  PCP:  Oliver Barre, MD    Chief Complaint: Insect Bite and Sore Throat   History of Present Illness:  Mathew Sanchez is a 57 y.o. very pleasant male patient who presents with the following:  Out in woods over the weekend and found five ticks on him that were removed.  Since has developed malaise and myalgias.  Denies rash.  Has lymphadenitis in right neck.  No fever or chills, coryza or cough. No improvement with over the counter medications or other home remedies. Denies other complaint or health concern today.   Patient Active Problem List   Diagnosis Date Noted  . Preventative health care 03/06/2012  . Encounter for long-term (current) use of other medications 10/15/2010  . HYPERLIPIDEMIA 07/29/2008  . ERECTILE DYSFUNCTION 07/29/2008  . HYPERTENSION 07/29/2008  . ALLERGIC RHINITIS 07/29/2008  . ACNE NEC 07/29/2008  . PSA, INCREASED 07/29/2008  . COLONIC POLYPS, HX OF 07/29/2008  . NEPHROLITHIASIS, HX OF 07/29/2008    Past Medical History  Diagnosis Date  . HYPERLIPIDEMIA 07/29/2008  . ERECTILE DYSFUNCTION 07/29/2008  . HYPERTENSION 07/29/2008  . ALLERGIC RHINITIS 07/29/2008  . ACNE NEC 07/29/2008  . PSA, INCREASED 07/29/2008  . COLONIC POLYPS, HX OF 07/29/2008  . NEPHROLITHIASIS, HX OF 07/29/2008    Past Surgical History  Procedure Laterality Date  . Tonsillectomy      History  Substance Use Topics  . Smoking status: Never Smoker   . Smokeless tobacco: Not on file  . Alcohol Use: Yes    History reviewed. No pertinent family history.  Allergies  Allergen Reactions  . Sildenafil Citrate Other (See Comments)    Make joints ache but can still take it    Medication list has been reviewed and updated.  Current Outpatient Prescriptions on File Prior to Visit  Medication Sig Dispense Refill  .  amLODipine-benazepril (LOTREL) 5-20 MG per capsule Take 1 capsule by mouth daily.  30 capsule  3  . aspirin 81 MG EC tablet Take 81 mg by mouth daily.        Marland Kitchen atorvastatin (LIPITOR) 10 MG tablet Take 1 tablet (10 mg total) by mouth daily.  30 tablet  3  . sildenafil (VIAGRA) 100 MG tablet Take 0.5-1 tablets (50-100 mg total) by mouth daily as needed for erectile dysfunction.  10 tablet  11  . vardenafil (LEVITRA) 20 MG tablet Take 1 tablet (20 mg total) by mouth as needed for erectile dysfunction.  10 tablet  5   No current facility-administered medications on file prior to visit.    Review of Systems:  As per HPI, otherwise negative.    Physical Examination: Filed Vitals:   11/27/12 1044  BP: 124/72  Pulse: 57  Temp: 98.1 F (36.7 C)  Resp: 16   Filed Vitals:   11/27/12 1044  Height: 6\' 4"  (1.93 m)  Weight: 202 lb (91.627 kg)   Body mass index is 24.6 kg/(m^2). Ideal Body Weight: Weight in (lb) to have BMI = 25: 205   GEN: WDWN, NAD, Non-toxic, Alert & Oriented x 3 HEENT: Atraumatic, Normocephalic.  Ears and Nose: No external deformity. EXTR: No clubbing/cyanosis/edema NEURO: Normal gait.  PSYCH: Normally interactive. Conversant. Not depressed or anxious appearing.  Calm demeanor.  SKIN:  No rash   Assessment and Plan: Tick infestation by  history Doxy Follow up as needed  Signed,  Phillips Odor, MD

## 2012-11-27 NOTE — Patient Instructions (Addendum)
Deer Tick Bite Deer ticks are brown arachnids (spider family) that vary in size from as small as the head of a pin to 1/4 inch (1/2 cm) diameter. They thrive in wooded areas. Deer are the preferred host of adult deer ticks. Small rodents are the host of young ticks (nymphs). When a person walks in a field or wooded area, young and adult ticks in the surrounding grass and vegetation can attach themselves to the skin. They can suck blood for hours to days if unnoticed. Ticks are found all over the [place] Some ticks carry a specific bacteria (Borrelia burgdorferi) that causes an infection called Lyme disease. The bacteria is typically passed into a person during the blood sucking process. This happens after the tick has been attached for at least a number of hours. While ticks can be found all over the [place] those carrying the bacteria that causes Lyme disease are most common in [place] and the [place]. Only a small proportion of ticks in these areas carry the Lyme disease bacteria and cause human infections. Ticks usually attach to warm spots on the body, such as the:  Head.  Back.  Neck.  Armpits.  Groin. SYMPTOMS  Most of the time, a deer tick bite will not be felt. You may or may not see the attached tick. You may notice mild irritation or redness around the bite site. If the deer tick passes the Lyme disease bacteria to a person, a round, red rash may be noticed 2 to 3 days after the bite. The rash may be clear in the middle, like a bull's-eye or target. If not treated, other symptoms may develop several days to weeks after the onset of the rash. These symptoms may include:  New rash lesions.  Fatigue and weakness.  General ill feeling and achiness.  Chills.  Headache and neck pain.  Swollen lymph glands.  Sore muscles and joints. 5 to 15% of untreated people with Lyme disease may develop more severe illnesses after several weeks to months. This may include inflammation of the  brain lining (meningitis), nerve palsies, an abnormal heartbeat, or severe muscle and joint pain and inflammation (myositis or arthritis). DIAGNOSIS   Physical exam and medical history.  Viewing the tick if it was saved for confirmation.  Blood tests (to check or confirm the presence of Lyme disease). TREATMENT  Most ticks do not carry disease. If found, an attached tick should be removed using tweezers. Tweezers should be placed under the body of the tick so it is removed by its attachment parts (pincers). If there are signs or symptoms of being sick, or Lyme disease is confirmed, medicines (antibiotics) that kill germs are usually prescribed. In more severe cases, antibiotics may be given through an intravenous (IV) access. HOME CARE INSTRUCTIONS   Always remove ticks with tweezers. Do not use petroleum jelly or other methods to kill or remove the tick. Slide the tweezers under the body and pull out as much as you can. If you are not sure what it is, save it in a jar and show your caregiver.  Once you remove the tick, the skin will heal on its own. Wash your hands and the affected area with water and soap. You may place a bandage on the affected area.  Take medicine as directed. You may be advised to take a full course of antibiotics.  Follow up with your caregiver as recommended. FINDING OUT THE RESULTS OF YOUR TEST Not all test results are available   be advised to take a full course of antibiotics.   Follow up with your caregiver as recommended.  FINDING OUT THE RESULTS OF YOUR TEST  Not all test results are available during your visit. If your test results are not back during the visit, make an appointment with your caregiver to find out the results. Do not assume everything is normal if you have not heard from your caregiver or the medical facility. It is important for you to follow up on all of your test results.  PROGNOSIS   If Lyme disease is confirmed, early treatment with antibiotics is very effective. Following preventive guidelines is important since it is possible to get the disease more than once.  PREVENTION    Wear long sleeves and long pants in  wooded or grassy areas. Tuck your pants into your socks.   Use an insect repellent while hiking.   Check yourself, your children, and your pets regularly for ticks after playing outside.   Clear piles of leaves or brush from your yard. Ticks might live there.  SEEK MEDICAL CARE IF:    You or your child has an oral temperature above 102 F (38.9 C).   You develop a severe headache following the bite.   You feel generally ill.   You notice a rash.   You are having trouble removing the tick.   The bite area has red skin or yellow drainage.  SEEK IMMEDIATE MEDICAL CARE IF:    Your face is weak and droopy or you have other neurological symptoms.   You have severe joint pain or weakness.  MAKE SURE YOU:    Understand these instructions.   Will watch your condition.   Will get help right away if you are not doing well or get worse.  FOR MORE INFORMATION  Centers for Disease Control and Prevention: www.cdc.gov  American Academy of Family Physicians: www.aafp.org  Document Released: 09/25/2009 Document Revised: 09/23/2011 Document Reviewed: 09/25/2009  [employer] Patient Information 2013 [employer].

## 2012-11-30 ENCOUNTER — Ambulatory Visit: Payer: Managed Care, Other (non HMO) | Admitting: Internal Medicine

## 2012-11-30 VITALS — BP 128/78 | HR 91 | Temp 98.2°F | Resp 16 | Ht 75.0 in | Wt 201.0 lb

## 2012-11-30 DIAGNOSIS — R21 Rash and other nonspecific skin eruption: Secondary | ICD-10-CM

## 2012-11-30 LAB — POCT CBC
Granulocyte percent: 74 %G (ref 37–80)
HCT, POC: 46.6 % (ref 43.5–53.7)
Hemoglobin: 15 g/dL (ref 14.1–18.1)
Lymph, poc: 0.8 (ref 0.6–3.4)
MCH, POC: 31.6 pg — AB (ref 27–31.2)
MCHC: 32.2 g/dL (ref 31.8–35.4)
MCV: 98.3 fL — AB (ref 80–97)
MID (cbc): 0.4 (ref 0–0.9)
MPV: 8.5 fL (ref 0–99.8)
POC Granulocyte: 3.6 (ref 2–6.9)
POC LYMPH PERCENT: 17.2 %L (ref 10–50)
POC MID %: 8.8 %M (ref 0–12)
Platelet Count, POC: 163 10*3/uL (ref 142–424)
RBC: 4.74 M/uL (ref 4.69–6.13)
RDW, POC: 13.7 %
WBC: 4.9 10*3/uL (ref 4.6–10.2)

## 2012-11-30 LAB — POCT SEDIMENTATION RATE: POCT SED RATE: 12 mm/hr (ref 0–22)

## 2012-11-30 MED ORDER — VALACYCLOVIR HCL 1 G PO TABS: 1000.0000 mg | ORAL_TABLET | Freq: Three times a day (TID) | ORAL | Status: DC

## 2012-11-30 NOTE — Progress Notes (Signed)
  Subjective:    Patient ID: Mathew Sanchez, male    DOB: June 16, 1956, 57 y.o.   MRN: [id number]  HPI return visit from 3 days ago Rash is spreading Started on the left occiput above and below the hairline about 3 weeks ago Has now spread to the right occiput, right neck and right shoulder No pruritus/mildly tender/some component of tingling pain extends into the right triceps He denies fever chills or night sweats No sore throat No conjunctivitis or ocular involvement No recent illnesses or new medications  He is on doxy for hx tick exposure per Dr Dareen Piano  Review of Systems Noncontributory    Objective:   Physical Exam BP 128/78  Pulse 91  Temp(Src) 98.2 F (36.8 C) (Oral)  Resp 16  Ht 6\' 3"  (1.905 m)  Wt 201 lb (91.173 kg)  BMI 25.12 kg/m2  SpO2 97% Pupils equal round reactive to light and accommodation/conjunctiva clear TMs clear Nares clear Oral pharynx clear He has an erythematous splotchy papular rash with some small vesicles/pustules and an occasional small bulla There is a confluent area over the right posterior cervical region extending around front It involves the scalp on the right and a little on the left which seems to be resolving There are a few lesions on his right lower jaw and on his right shoulder There is no hypersensitivity although the rash is tender especially in the anterior cervical area/small anterior cervical node Chest clear       Results for orders placed in visit on 11/30/12  POCT CBC      Result Value Range   WBC 4.9  4.6 - 10.2 K/uL   Lymph, poc 0.8  0.6 - 3.4   POC LYMPH PERCENT 17.2  10 - 50 %L   MID (cbc) 0.4  0 - 0.9   POC MID % 8.8  0 - 12 %M   POC Granulocyte 3.6  2 - 6.9   Granulocyte percent 74.0  37 - 80 %G   RBC 4.74  4.69 - 6.13 M/uL   Hemoglobin 15.0  14.1 - 18.1 g/dL   HCT, POC 40.9  81.1 - 53.7 %   MCV 98.3 (*) 80 - 97 fL   MCH, POC 31.6 (*) 27 - 31.2 pg   MCHC 32.2  31.8 - 35.4 g/dL   RDW, POC 91.4     Platelet Count, POC 163  142 - 424 K/uL   MPV 8.5  0 - 99.8 fL   Sedimentation rate 12 Assessment & Plan:  Problem #1 unusual erythematous papulovesicular rash evolving over at least 3 weeks  Culture for herpes virus Started on Valtrex  The time course seems too long for herpes The involvement of more than one dermatome and 2 sides of the body argues against zoster This should not be a tick borne disease It also seems unlikely to be an allergic reaction because of the time course and the lack of pruritus  We have established an urgent referral to dermatology

## 2012-12-02 LAB — HERPES SIMPLEX VIRUS CULTURE: Organism ID, Bacteria: NOT DETECTED

## 2013-03-16 ENCOUNTER — Other Ambulatory Visit (INDEPENDENT_AMBULATORY_CARE_PROVIDER_SITE_OTHER): Payer: Managed Care, Other (non HMO)

## 2013-03-16 DIAGNOSIS — Z0000 Encounter for general adult medical examination without abnormal findings: Secondary | ICD-10-CM

## 2013-03-16 LAB — BASIC METABOLIC PANEL
BUN: 22 mg/dL (ref 6–23)
CO2: 28 mEq/L (ref 19–32)
Calcium: 9 mg/dL (ref 8.4–10.5)
Chloride: 103 mEq/L (ref 96–112)
Creatinine, Ser: 1.2 mg/dL (ref 0.4–1.5)
GFR: 65.24 mL/min (ref >60.00)
Glucose, Bld: 87 mg/dL (ref 70–99)
Potassium: 4.3 mEq/L (ref 3.5–5.1)
Sodium: 136 mEq/L (ref 135–145)

## 2013-03-16 LAB — URINALYSIS, ROUTINE W REFLEX MICROSCOPIC
Bilirubin Urine: NEGATIVE
Ketones, ur: NEGATIVE
Leukocytes, UA: NEGATIVE
Nitrite: NEGATIVE
Specific Gravity, Urine: >=1.03 (ref 1.000–1.030)
Total Protein, Urine: 30
Urine Glucose: NEGATIVE
Urobilinogen, UA: 0.2 (ref 0.0–1.0)
WBC, UA: NONE SEEN (ref 0–?)
pH: 6 (ref 5.0–8.0)

## 2013-03-16 LAB — CBC WITH DIFFERENTIAL/PLATELET
Basophils Absolute: 0 10*3/uL (ref 0.0–0.1)
Basophils Relative: 0.4 % (ref 0.0–3.0)
Eosinophils Absolute: 0.2 10*3/uL (ref 0.0–0.7)
Eosinophils Relative: 3.9 % (ref 0.0–5.0)
HCT: 41.6 % (ref 39.0–52.0)
Hemoglobin: 14.3 g/dL (ref 13.0–17.0)
Lymphocytes Relative: 24.6 % (ref 12.0–46.0)
Lymphs Abs: 1.4 10*3/uL (ref 0.7–4.0)
MCHC: 34.3 g/dL (ref 30.0–36.0)
MCV: 92.8 fl (ref 78.0–100.0)
Monocytes Absolute: 0.7 10*3/uL (ref 0.1–1.0)
Monocytes Relative: 12 % (ref 3.0–12.0)
Neutro Abs: 3.4 10*3/uL (ref 1.4–7.7)
Neutrophils Relative %: 59.1 % (ref 43.0–77.0)
Platelets: 182 10*3/uL (ref 150.0–400.0)
RBC: 4.49 Mil/uL (ref 4.22–5.81)
RDW: 12.9 % (ref 11.5–14.6)
WBC: 5.7 10*3/uL (ref 4.5–10.5)

## 2013-03-16 LAB — LIPID PANEL
Cholesterol: 142 mg/dL (ref 0–200)
HDL: 32.1 mg/dL — ABNORMAL LOW (ref >39.00)
LDL Cholesterol: 84 mg/dL (ref 0–99)
Total CHOL/HDL Ratio: 4
Triglycerides: 130 mg/dL (ref 0.0–149.0)
VLDL: 26 mg/dL (ref 0.0–40.0)

## 2013-03-16 LAB — HEPATIC FUNCTION PANEL
ALT: 24 U/L (ref 0–53)
AST: 28 U/L (ref 0–37)
Albumin: 3.9 g/dL (ref 3.5–5.2)
Alkaline Phosphatase: 54 U/L (ref 39–117)
Bilirubin, Direct: 0.1 mg/dL (ref 0.0–0.3)
Total Bilirubin: 0.9 mg/dL (ref 0.3–1.2)
Total Protein: 6.8 g/dL (ref 6.0–8.3)

## 2013-03-16 LAB — PSA: PSA: 1.28 ng/mL (ref 0.10–4.00)

## 2013-03-16 LAB — TSH: TSH: 2.39 u[IU]/mL (ref 0.35–5.50)

## 2013-03-30 ENCOUNTER — Ambulatory Visit (INDEPENDENT_AMBULATORY_CARE_PROVIDER_SITE_OTHER): Payer: Managed Care, Other (non HMO) | Admitting: Internal Medicine

## 2013-03-30 ENCOUNTER — Encounter: Payer: Self-pay | Admitting: Internal Medicine

## 2013-03-30 VITALS — BP 132/80 | HR 66 | Temp 98.1°F | Ht 75.0 in | Wt 203.1 lb

## 2013-03-30 DIAGNOSIS — E785 Hyperlipidemia, unspecified: Secondary | ICD-10-CM

## 2013-03-30 DIAGNOSIS — Z0000 Encounter for general adult medical examination without abnormal findings: Secondary | ICD-10-CM

## 2013-03-30 DIAGNOSIS — B0229 Other postherpetic nervous system involvement: Secondary | ICD-10-CM

## 2013-03-30 MED ORDER — AMLODIPINE BESY-BENAZEPRIL HCL 5-20 MG PO CAPS: 1.0000 | ORAL_CAPSULE | Freq: Every day | ORAL | Status: DC

## 2013-03-30 MED ORDER — ATORVASTATIN CALCIUM 10 MG PO TABS: 10.0000 mg | ORAL_TABLET | Freq: Every day | ORAL | Status: DC

## 2013-03-30 NOTE — Patient Instructions (Addendum)
Please call for your colonoscopy follow up exam Please call if you change your mind about the gabapentin Please continue all other medications as before, and refills have been done if requested. Please have the pharmacy call with any other refills you may need. Please continue your efforts at being more active, low cholesterol diet, and weight control. You are otherwise up to date with prevention measures today. Please keep your appointments with your specialists as you may have planned  Please remember to sign up for My Chart if you have not done so, as this will be important to you in the future with finding out test results, communicating by private email, and scheduling acute appointments online when needed.  Please return in 1 year for your yearly visit, or sooner if needed, with Lab testing done 3-5 days before

## 2013-03-30 NOTE — Assessment & Plan Note (Signed)
stable overall by history and exam, recent data reviewed with pt, and pt to continue medical treatment as before,  to f/u any worsening symptoms or concerns Lab Results  Component Value Date   LDLCALC 84 03/16/2013

## 2013-03-30 NOTE — Assessment & Plan Note (Signed)
Declines trial gabapentin, to call if changes his mind

## 2013-03-30 NOTE — Progress Notes (Signed)
Subjective:    Patient ID: Mathew Ramus., male    DOB: 04-22-1956, 57 y.o.   MRN: [id number]  HPI  Here for wellness and f/u;  Overall doing ok;  Pt denies CP, worsening SOB, DOE, wheezing, orthopnea, PND, worsening LE edema, palpitations, dizziness or syncope.  Pt denies neurological change such as new headache, facial or extremity weakness.  Pt denies polydipsia, polyuria, or low sugar symptoms. Pt states overall good compliance with treatment and medications, good tolerability, and has been trying to follow lower cholesterol diet.  Pt denies worsening depressive symptoms, suicidal ideation or panic. No fever, night sweats, wt loss, loss of appetite, or other constitutional symptoms.  Pt states good ability with ADL's, has low fall risk, home safety reviewed and adequate, no other significant changes in hearing or vision, and only occasionally active with exercise.  Did have recent shingles to right neck/occiput/upper back, still with some PHN type pain to the right SCM area, and itching to the occiput, where he developed a staph infection, tx with doxy 100 qd for 30 days per derm, now resolving, only 4 pills left.  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  Has some ongoing ED Past Medical History  Diagnosis Date  . HYPERLIPIDEMIA 07/29/2008  . ERECTILE DYSFUNCTION 07/29/2008  . HYPERTENSION 07/29/2008  . ALLERGIC RHINITIS 07/29/2008  . ACNE NEC 07/29/2008  . PSA, INCREASED 07/29/2008  . COLONIC POLYPS, HX OF 07/29/2008  . NEPHROLITHIASIS, HX OF 07/29/2008   Past Surgical History  Procedure Laterality Date  . Tonsillectomy      reports that he has never smoked. He does not have any smokeless tobacco history on file. He reports that  drinks alcohol. His drug history is not on file. family history is not on file. Allergies  Allergen Reactions  . Sildenafil Citrate Other (See Comments)    Make joints ache but can still take it   Current Outpatient  Prescriptions on File Prior to Visit  Medication Sig Dispense Refill  . aspirin 81 MG EC tablet Take 81 mg by mouth daily.        Marland Kitchen doxycycline (VIBRAMYCIN) 100 MG capsule Take 1 capsule (100 mg total) by mouth 2 (two) times daily.  20 capsule  0  . valACYclovir (VALTREX) 1000 MG tablet Take 1 tablet (1,000 mg total) by mouth 3 (three) times daily.  21 tablet  0  . sildenafil (VIAGRA) 100 MG tablet Take 0.5-1 tablets (50-100 mg total) by mouth daily as needed for erectile dysfunction.  10 tablet  11  . vardenafil (LEVITRA) 20 MG tablet Take 1 tablet (20 mg total) by mouth as needed for erectile dysfunction.  10 tablet  5   No current facility-administered medications on file prior to visit.   Review of Systems Constitutional: Negative for diaphoresis, activity change, appetite change or unexpected weight change.  HENT: Negative for hearing loss, ear pain, facial swelling, mouth sores and neck stiffness.   Eyes: Negative for pain, redness and visual disturbance.  Respiratory: Negative for shortness of breath and wheezing.   Cardiovascular: Negative for chest pain and palpitations.  Gastrointestinal: Negative for diarrhea, blood in stool, abdominal distention or other pain Genitourinary: Negative for hematuria, flank pain or change in urine volume.  Musculoskeletal: Negative for myalgias and joint swelling.  Skin: Negative for color change and wound.  Neurological: Negative for syncope and numbness. other than noted Hematological: Negative for adenopathy.  Psychiatric/Behavioral: Negative for hallucinations,  self-injury, decreased concentration and agitation.      Objective:   Physical Exam BP 132/80  Pulse 66  Temp(Src) 98.1 F (36.7 C) (Oral)  Ht 6\' 3"  (1.905 m)  Wt 203 lb 2 oz (92.137 kg)  BMI 25.39 kg/m2  SpO2 97% VS noted,  Constitutional: Pt is oriented to person, place, and time. Appears well-developed and well-nourished.  Head: Normocephalic and atraumatic.  Right Ear:  External ear normal.  Left Ear: External ear normal.  Nose: Nose normal.  Mouth/Throat: Oropharynx is clear and moist.  Eyes: Conjunctivae and EOM are normal. Pupils are equal, round, and reactive to light.  Neck: Normal range of motion. Neck supple. No JVD present. No tracheal deviation present.  Cardiovascular: Normal rate, regular rhythm, normal heart sounds and intact distal pulses.   Pulmonary/Chest: Effort normal and breath sounds normal.  Abdominal: Soft. Bowel sounds are normal. There is no tenderness. No HSM  Musculoskeletal: Normal range of motion. Exhibits no edema.  Lymphadenopathy:  Has no cervical adenopathy.  Neurological: Pt is alert and oriented to person, place, and time. Pt has normal reflexes. No cranial nerve deficit.  Skin: Skin is warm and dry. No rash noted.  Psychiatric:  Has  normal mood and affect. Behavior is normal.     Assessment & Plan:

## 2013-03-30 NOTE — Assessment & Plan Note (Addendum)

## 2013-05-13 ENCOUNTER — Encounter: Payer: Self-pay | Admitting: Gastroenterology

## 2013-05-14 ENCOUNTER — Encounter: Payer: Self-pay | Admitting: Gastroenterology

## 2013-05-17 ENCOUNTER — Ambulatory Visit (AMBULATORY_SURGERY_CENTER): Payer: Self-pay | Admitting: *Deleted

## 2013-05-17 VITALS — Ht 75.0 in | Wt 204.0 lb

## 2013-05-17 DIAGNOSIS — Z86010 Personal history of colonic polyps: Secondary | ICD-10-CM

## 2013-05-17 MED ORDER — MOVIPREP 100 G PO SOLR: 1.0000 | Freq: Once | ORAL | Status: DC

## 2013-05-17 NOTE — Progress Notes (Signed)
No egg or soy allergy. ewm No home 02 use , no cpap use. ewm No problems with past sedation. ewm Pt declined emmi. ewm Pt was initially scheduled for 11-21. RS for 11-24. Pt cannot arrive any earlier than 130 pm due to his wife works and cannot be here any earlier than this so RS for 230 pm appt per request of pt. ewm

## 2013-05-21 ENCOUNTER — Encounter: Payer: Self-pay | Admitting: Gastroenterology

## 2013-05-31 ENCOUNTER — Encounter: Payer: Managed Care, Other (non HMO) | Admitting: Gastroenterology

## 2013-06-04 ENCOUNTER — Encounter: Payer: Managed Care, Other (non HMO) | Admitting: Gastroenterology

## 2013-06-07 ENCOUNTER — Encounter: Payer: Self-pay | Admitting: Gastroenterology

## 2013-06-07 ENCOUNTER — Ambulatory Visit (AMBULATORY_SURGERY_CENTER): Payer: Managed Care, Other (non HMO) | Admitting: Gastroenterology

## 2013-06-07 VITALS — BP 114/73 | HR 62 | Temp 97.9°F | Resp 25 | Ht 75.0 in | Wt 204.0 lb

## 2013-06-07 DIAGNOSIS — Z86010 Personal history of colonic polyps: Secondary | ICD-10-CM

## 2013-06-07 MED ORDER — SODIUM CHLORIDE 0.9 % IV SOLN: 500.0000 mL | INTRAVENOUS | Status: DC

## 2013-06-07 NOTE — Patient Instructions (Signed)
YOU HAD AN ENDOSCOPIC PROCEDURE TODAY AT THE [facility]: Refer to the procedure report that was given to you for any specific questions about what was found during the examination.  If the procedure report does not answer your questions, please call your gastroenterologist to clarify.  If you requested that your care partner not be given the details of your procedure findings, then the procedure report has been included in a sealed envelope for you to review at your convenience later.  YOU SHOULD EXPECT: Some feelings of bloating in the abdomen. Passage of more gas than usual.  Walking can help get rid of the air that was put into your GI tract during the procedure and reduce the bloating. If you had a lower endoscopy (such as a colonoscopy or flexible sigmoidoscopy) you may notice spotting of blood in your stool or on the toilet paper. If you underwent a bowel prep for your procedure, then you may not have a normal bowel movement for a few days.  DIET: Your first meal following the procedure should be a light meal and then it is ok to progress to your normal diet.  A half-sandwich or bowl of soup is an example of a good first meal.  Heavy or fried foods are harder to digest and may make you feel nauseous or bloated.  Likewise meals heavy in dairy and vegetables can cause extra gas to form and this can also increase the bloating.  Drink plenty of fluids but you should avoid alcoholic beverages for 24 hours.  ACTIVITY: Your care partner should take you home directly after the procedure.  You should plan to take it easy, moving slowly for the rest of the day.  You can resume normal activity the day after the procedure however you should NOT DRIVE or use heavy machinery for 24 hours (because of the sedation medicines used during the test).    SYMPTOMS TO REPORT IMMEDIATELY: A gastroenterologist can be reached at any hour.  During normal business hours, 8:30 AM to 5:00 PM Monday through Friday,  call [phone].  After hours and on weekends, please call the GI answering service at [phone] who will take a message and have the physician on call contact you.   Following lower endoscopy (colonoscopy or flexible sigmoidoscopy):  Excessive amounts of blood in the stool  Significant tenderness or worsening of abdominal pains  Swelling of the abdomen that is new, acute  Fever of 100F or higher    FOLLOW UP: If any biopsies were taken you will be contacted by phone or by letter within the next 1-3 weeks.  Call your gastroenterologist if you have not heard about the biopsies in 3 weeks.  Our staff will call the home number listed on your records the next business day following your procedure to check on you and address any questions or concerns that you may have at that time regarding the information given to you following your procedure. This is a courtesy call and so if there is no answer at the home number and we have not heard from you through the emergency physician on call, we will assume that you have returned to your regular daily activities without incident.  SIGNATURES/CONFIDENTIALITY: You and/or your care partner have signed paperwork which will be entered into your electronic medical record.  These signatures attest to the fact that that the information above on your After Visit Summary has been reviewed and is understood.  Full responsibility of the confidentiality   of this discharge information lies with you and/or your care-partner.  Normal colonoscopy.  Repeat in 10 years-2024

## 2013-06-07 NOTE — Progress Notes (Signed)
Patient did not experience any of the following events: a burn prior to discharge; a fall within the facility; wrong site/side/patient/procedure/implant event; or a hospital transfer or hospital admission upon discharge from the facility. (G8907) Patient did not have preoperative order for IV antibiotic SSI prophylaxis. (G8918)  

## 2013-06-07 NOTE — Progress Notes (Signed)
Re[port to pacu rn,s, bbs=clear

## 2013-06-07 NOTE — Op Note (Signed)
[facility] [address] [place], [address]   COLONOSCOPY PROCEDURE REPORT  PATIENT: Mathew Sanchez, Mathew Sanchez  MR#: [id number] BIRTHDATE: 08/24/1955 , 57  yrs. old GENDER: Male ENDOSCOPIST: Mardella Layman, MD, [facility] REFERRED BY: PROCEDURE DATE:  06/07/2013 PROCEDURE:   Colonoscopy, screening First Screening Colonoscopy - Avg.  risk and is 50 yrs.  old or older - No.  Prior Negative Screening - Now for repeat screening. N/A  History of Adenoma - Now for follow-up colonoscopy & has been > or = to 3 yrs.  Yes hx of adenoma.  Has been 3 or more years since last colonoscopy.  Polyps Removed Today? No.  Recommend repeat exam, <10 yrs? ASA CLASS:   Class II INDICATIONS:Patient's personal history of adenomatous colon polyps.  MEDICATIONS: propofol (Diprivan) 300mg  IV  DESCRIPTION OF PROCEDURE:   After the risks benefits and alternatives of the procedure were thoroughly explained, informed consent was obtained.  A digital rectal exam revealed no abnormalities of the rectum.   The LB [id number]  endoscope was introduced through the anus and advanced to the cecum, which was identified by both the appendix and ileocecal valve. No adverse events experienced.   The quality of the prep was excellent, using MoviPrep  The instrument was then slowly withdrawn as the colon was fully examined.      COLON FINDINGS: A normal appearing cecum, ileocecal valve, and appendiceal orifice were identified.  The ascending, hepatic flexure, transverse, splenic flexure, descending, sigmoid colon and rectum appeared unremarkable.  No polyps or cancers were seen. Retroflexed views revealed no abnormalities. The time to cecum=4 minutes 27 seconds.  Withdrawal time=8 minutes 14 seconds.  The scope was withdrawn and the procedure completed. COMPLICATIONS: There were no complications.  ENDOSCOPIC IMPRESSION: Normal colon ...no polyps noted...  RECOMMENDATIONS: 1.  Continue  current medications 2.  Continue current colorectal screening recommendations for "routine risk" patients with a repeat colonoscopy in 10 years.   eSigned:  Mardella Layman, MD, [facility] 06/07/2013 2:40 PM   cc:

## 2013-06-08 ENCOUNTER — Telehealth: Payer: Self-pay

## 2013-06-08 NOTE — Telephone Encounter (Signed)
Left message on answering machine. 

## 2014-03-18 ENCOUNTER — Encounter: Payer: Self-pay | Admitting: Gastroenterology

## 2014-03-25 ENCOUNTER — Telehealth: Payer: Self-pay | Admitting: Internal Medicine

## 2014-03-25 MED ORDER — SILDENAFIL CITRATE 100 MG PO TABS: 50.0000 mg | ORAL_TABLET | Freq: Every day | ORAL | Status: DC | PRN

## 2014-03-25 NOTE — Telephone Encounter (Signed)
Patient is request script for Viagra.  He uses [facility] [address].

## 2014-03-25 NOTE — Telephone Encounter (Signed)
Patient informed script sent in. 

## 2014-03-25 NOTE — Telephone Encounter (Signed)
Done erx 

## 2014-07-12 ENCOUNTER — Telehealth: Payer: Self-pay

## 2014-07-12 DIAGNOSIS — Z0000 Encounter for general adult medical examination without abnormal findings: Secondary | ICD-10-CM

## 2014-07-12 NOTE — Telephone Encounter (Signed)
cpx labs entered  

## 2014-07-28 ENCOUNTER — Ambulatory Visit (INDEPENDENT_AMBULATORY_CARE_PROVIDER_SITE_OTHER): Payer: BLUE CROSS/BLUE SHIELD | Admitting: Internal Medicine

## 2014-07-28 ENCOUNTER — Other Ambulatory Visit (INDEPENDENT_AMBULATORY_CARE_PROVIDER_SITE_OTHER): Payer: BLUE CROSS/BLUE SHIELD

## 2014-07-28 ENCOUNTER — Encounter: Payer: Self-pay | Admitting: Internal Medicine

## 2014-07-28 VITALS — BP 130/80 | HR 61 | Temp 98.0°F | Ht 75.0 in | Wt 207.2 lb

## 2014-07-28 DIAGNOSIS — Z0000 Encounter for general adult medical examination without abnormal findings: Secondary | ICD-10-CM

## 2014-07-28 DIAGNOSIS — F528 Other sexual dysfunction not due to a substance or known physiological condition: Secondary | ICD-10-CM

## 2014-07-28 DIAGNOSIS — N529 Male erectile dysfunction, unspecified: Secondary | ICD-10-CM

## 2014-07-28 DIAGNOSIS — E785 Hyperlipidemia, unspecified: Secondary | ICD-10-CM

## 2014-07-28 LAB — CBC WITH DIFFERENTIAL/PLATELET
Basophils Absolute: 0 10*3/uL (ref 0.0–0.1)
Basophils Relative: 0.5 % (ref 0.0–3.0)
Eosinophils Absolute: 0.1 10*3/uL (ref 0.0–0.7)
Eosinophils Relative: 1.1 % (ref 0.0–5.0)
HCT: 46.3 % (ref 39.0–52.0)
Hemoglobin: 15.5 g/dL (ref 13.0–17.0)
Lymphocytes Relative: 19.9 % (ref 12.0–46.0)
Lymphs Abs: 1.3 10*3/uL (ref 0.7–4.0)
MCHC: 33.5 g/dL (ref 30.0–36.0)
MCV: 93.8 fl (ref 78.0–100.0)
Monocytes Absolute: 0.7 10*3/uL (ref 0.1–1.0)
Monocytes Relative: 10.3 % (ref 3.0–12.0)
Neutro Abs: 4.3 10*3/uL (ref 1.4–7.7)
Neutrophils Relative %: 68.2 % (ref 43.0–77.0)
Platelets: 208 10*3/uL (ref 150.0–400.0)
RBC: 4.94 Mil/uL (ref 4.22–5.81)
RDW: 13.2 % (ref 11.5–15.5)
WBC: 6.4 10*3/uL (ref 4.0–10.5)

## 2014-07-28 LAB — URINALYSIS, ROUTINE W REFLEX MICROSCOPIC
Bilirubin Urine: NEGATIVE
Hgb urine dipstick: NEGATIVE
Ketones, ur: NEGATIVE
Leukocytes, UA: NEGATIVE
Nitrite: NEGATIVE
RBC / HPF: NONE SEEN (ref 0–?)
Specific Gravity, Urine: 1.025 (ref 1.000–1.030)
Total Protein, Urine: NEGATIVE
Urine Glucose: NEGATIVE
Urobilinogen, UA: 0.2 (ref 0.0–1.0)
pH: 6 (ref 5.0–8.0)

## 2014-07-28 LAB — HEPATIC FUNCTION PANEL
ALT: 18 U/L (ref 0–53)
AST: 22 U/L (ref 0–37)
Albumin: 4.3 g/dL (ref 3.5–5.2)
Alkaline Phosphatase: 78 U/L (ref 39–117)
Bilirubin, Direct: 0.1 mg/dL (ref 0.0–0.3)
Total Bilirubin: 0.9 mg/dL (ref 0.2–1.2)
Total Protein: 7.5 g/dL (ref 6.0–8.3)

## 2014-07-28 LAB — BASIC METABOLIC PANEL
BUN: 21 mg/dL (ref 6–23)
CO2: 26 mEq/L (ref 19–32)
Calcium: 9.2 mg/dL (ref 8.4–10.5)
Chloride: 102 mEq/L (ref 96–112)
Creatinine, Ser: 1.09 mg/dL (ref 0.40–1.50)
GFR: 73.93 mL/min (ref >60.00)
Glucose, Bld: 86 mg/dL (ref 70–99)
Potassium: 4.3 mEq/L (ref 3.5–5.1)
Sodium: 138 mEq/L (ref 135–145)

## 2014-07-28 LAB — LIPID PANEL
Cholesterol: 150 mg/dL (ref 0–200)
HDL: 39 mg/dL — ABNORMAL LOW (ref >39.00)
LDL Cholesterol: 83 mg/dL (ref 0–99)
NonHDL: 111
Total CHOL/HDL Ratio: 4
Triglycerides: 140 mg/dL (ref 0.0–149.0)
VLDL: 28 mg/dL (ref 0.0–40.0)

## 2014-07-28 LAB — PSA: PSA: 1.85 ng/mL (ref 0.10–4.00)

## 2014-07-28 LAB — TSH: TSH: 1.28 u[IU]/mL (ref 0.35–4.50)

## 2014-07-28 MED ORDER — ATORVASTATIN CALCIUM 10 MG PO TABS: 10.0000 mg | ORAL_TABLET | Freq: Every day | ORAL | Status: DC

## 2014-07-28 MED ORDER — SILDENAFIL CITRATE 20 MG PO TABS: ORAL_TABLET | ORAL | Status: DC

## 2014-07-28 MED ORDER — AMLODIPINE BESY-BENAZEPRIL HCL 5-20 MG PO CAPS: 1.0000 | ORAL_CAPSULE | Freq: Every day | ORAL | Status: DC

## 2014-07-28 NOTE — Progress Notes (Signed)
Pre visit review using our clinic review tool, if applicable. No additional management support is needed unless otherwise documented below in the visit note. 

## 2014-07-28 NOTE — Progress Notes (Signed)
Subjective:    Patient ID: Mathew Ramus., male    DOB: December 23, 1955, 59 y.o.   MRN: [id number]  HPI Here for wellness and f/u;  Overall doing ok;  Pt denies CP, worsening SOB, DOE, wheezing, orthopnea, PND, worsening LE edema, palpitations, dizziness or syncope.  Pt denies neurological change such as new headache, facial or extremity weakness.  Pt denies polydipsia, polyuria, or low sugar symptoms. Pt states overall good compliance with treatment and medications, good tolerability, and has been trying to follow lower cholesterol diet.  Pt denies worsening depressive symptoms, suicidal ideation or panic. No fever, night sweats, wt loss, loss of appetite, or other constitutional symptoms.  Pt states good ability with ADL's, has low fall risk, home safety reviewed and adequate, no other significant changes in hearing or vision, and only occasionally active with exercise. No current complaints except ED med too expensiv Past Medical History  Diagnosis Date  . HYPERLIPIDEMIA 07/29/2008  . ERECTILE DYSFUNCTION 07/29/2008  . HYPERTENSION 07/29/2008  . ALLERGIC RHINITIS 07/29/2008  . ACNE NEC 07/29/2008  . PSA, INCREASED 07/29/2008  . COLONIC POLYPS, HX OF 07/29/2008  . NEPHROLITHIASIS, HX OF 07/29/2008  . Chronic kidney disease     kidney stones   Past Surgical History  Procedure Laterality Date  . Tonsillectomy    . Colonoscopy    . Polypectomy      reports that he has never smoked. He has never used smokeless tobacco. He reports that he drinks alcohol. He reports that he does not use illicit drugs. family history is negative for Colon cancer, Rectal cancer, Stomach cancer, and Esophageal cancer. Allergies  Allergen Reactions  . Sildenafil Citrate Other (See Comments)    Make joints ache but can still take it   Current Outpatient Prescriptions on File Prior to Visit  Medication Sig Dispense Refill  . aspirin 81 MG EC tablet Take 81 mg by mouth daily.      . sildenafil (VIAGRA) 100 MG  tablet Take 0.5-1 tablets (50-100 mg total) by mouth daily as needed for erectile dysfunction. 5 tablet 11   No current facility-administered medications on file prior to visit.   Review of Systems Constitutional: Negative for increased diaphoresis, other activity, appetite or other siginficant weight change  HENT: Negative for worsening hearing loss, ear pain, facial swelling, mouth sores and neck stiffness.   Eyes: Negative for other worsening pain, redness or visual disturbance.  Respiratory: Negative for shortness of breath and wheezing.   Cardiovascular: Negative for chest pain and palpitations.  Gastrointestinal: Negative for diarrhea, blood in stool, abdominal distention or other pain Genitourinary: Negative for hematuria, flank pain or change in urine volume.  Musculoskeletal: Negative for myalgias or other joint complaints.  Skin: Negative for color change and wound.  Neurological: Negative for syncope and numbness. other than noted Hematological: Negative for adenopathy. or other swelling Psychiatric/Behavioral: Negative for hallucinations, self-injury, decreased concentration or other worsening agitation.      Objective:   Physical Exam BP 130/80 mmHg  Pulse 61  Temp(Src) 98 F (36.7 C) (Oral)  Ht  (1.905 m)  Wt 207 lb 4 oz (94.008 kg)  BMI 25.90 kg/m2  SpO2 97% VS noted,  Constitutional: Pt is oriented to person, place, and time. Appears well-developed and well-nourished.  Head: Normocephalic and atraumatic.  Right Ear: External ear normal.  Left Ear: External ear normal.  Nose: Nose normal.  Mouth/Throat: Oropharynx is clear and moist.  Eyes: Conjunctivae and EOM are normal. Pupils are  equal, round, and reactive to light.  Neck: Normal range of motion. Neck supple. No JVD present. No tracheal deviation present.  Cardiovascular: Normal rate, regular rhythm, normal heart sounds and intact distal pulses.   Pulmonary/Chest: Effort normal and breath sounds without  rales or wheezing  Abdominal: Soft. Bowel sounds are normal. NT. No HSM  Musculoskeletal: Normal range of motion. Exhibits no edema.  Lymphadenopathy:  Has no cervical adenopathy.  Neurological: Pt is alert and oriented to person, place, and time. Pt has normal reflexes. No cranial nerve deficit. Motor grossly intact Skin: Skin is warm and dry. No rash noted.  Psychiatric:  Has normal mood and affect. Behavior is normal.  Wt Readings from Last 3 Encounters:  07/28/14 207 lb 4 oz (94.008 kg)  06/07/13 204 lb (92.534 kg)  05/17/13 204 lb (92.534 kg)       Assessment & Plan:

## 2014-07-28 NOTE — Patient Instructions (Addendum)
Please take all new medication as prescribed - the revatio  [facility] Address: [address], [place], [place] [id number]  Please continue all other medications as before, and refills have been done if requested.  Please have the pharmacy call with any other refills you may need.  Please continue your efforts at being more active, low cholesterol diet, and weight control.  You are otherwise up to date with prevention measures today.  Please keep your appointments with your specialists as you may have planned  Please go to the LAB in the Basement (turn left off the elevator) for the tests to be done today  You will be contacted by phone if any changes need to be made immediately.  Otherwise, you will receive a letter about your results with an explanation, but please check with MyChart first.  Please remember to sign up for MyChart if you have not done so, as this will be important to you in the future with finding out test results, communicating by private email, and scheduling acute appointments online when needed.  Please return in 1 year for your yearly visit, or sooner if needed, with Lab testing done 3-5 days before

## 2014-07-31 NOTE — Assessment & Plan Note (Signed)

## 2014-07-31 NOTE — Assessment & Plan Note (Signed)
Ok for revatio asd,  to f/u any worsening symptoms or concerns

## 2014-08-04 ENCOUNTER — Encounter: Payer: Managed Care, Other (non HMO) | Admitting: Internal Medicine

## 2014-09-15 ENCOUNTER — Encounter: Payer: Self-pay | Admitting: Internal Medicine

## 2014-09-15 ENCOUNTER — Other Ambulatory Visit (INDEPENDENT_AMBULATORY_CARE_PROVIDER_SITE_OTHER): Payer: BLUE CROSS/BLUE SHIELD

## 2014-09-15 DIAGNOSIS — N529 Male erectile dysfunction, unspecified: Secondary | ICD-10-CM

## 2014-09-15 LAB — TESTOSTERONE: Testosterone: 324.41 ng/dL (ref 300.00–890.00)

## 2015-07-28 ENCOUNTER — Other Ambulatory Visit (INDEPENDENT_AMBULATORY_CARE_PROVIDER_SITE_OTHER): Payer: BLUE CROSS/BLUE SHIELD

## 2015-07-28 DIAGNOSIS — Z0000 Encounter for general adult medical examination without abnormal findings: Secondary | ICD-10-CM | POA: Diagnosis not present

## 2015-07-28 LAB — CBC WITH DIFFERENTIAL/PLATELET
Basophils Absolute: 0 10*3/uL (ref 0.0–0.1)
Basophils Relative: 0.7 % (ref 0.0–3.0)
Eosinophils Absolute: 0.2 10*3/uL (ref 0.0–0.7)
Eosinophils Relative: 3.1 % (ref 0.0–5.0)
HCT: 45 % (ref 39.0–52.0)
Hemoglobin: 15.1 g/dL (ref 13.0–17.0)
Lymphocytes Relative: 25.8 % (ref 12.0–46.0)
Lymphs Abs: 1.4 10*3/uL (ref 0.7–4.0)
MCHC: 33.6 g/dL (ref 30.0–36.0)
MCV: 93.6 fl (ref 78.0–100.0)
Monocytes Absolute: 0.6 10*3/uL (ref 0.1–1.0)
Monocytes Relative: 10.6 % (ref 3.0–12.0)
Neutro Abs: 3.1 10*3/uL (ref 1.4–7.7)
Neutrophils Relative %: 59.8 % (ref 43.0–77.0)
Platelets: 211 10*3/uL (ref 150.0–400.0)
RBC: 4.81 Mil/uL (ref 4.22–5.81)
RDW: 13.2 % (ref 11.5–15.5)
WBC: 5.2 10*3/uL (ref 4.0–10.5)

## 2015-07-28 LAB — PSA: PSA: 1.37 ng/mL (ref 0.10–4.00)

## 2015-07-28 LAB — URINALYSIS, ROUTINE W REFLEX MICROSCOPIC
Bilirubin Urine: NEGATIVE
Hgb urine dipstick: NEGATIVE
Ketones, ur: NEGATIVE
Leukocytes, UA: NEGATIVE
Nitrite: NEGATIVE
RBC / HPF: NONE SEEN (ref 0–?)
Specific Gravity, Urine: 1.025 (ref 1.000–1.030)
Urine Glucose: NEGATIVE
Urobilinogen, UA: 0.2 (ref 0.0–1.0)
pH: 6 (ref 5.0–8.0)

## 2015-07-28 LAB — HEPATIC FUNCTION PANEL
ALT: 19 U/L (ref 0–53)
AST: 21 U/L (ref 0–37)
Albumin: 4.1 g/dL (ref 3.5–5.2)
Alkaline Phosphatase: 77 U/L (ref 39–117)
Bilirubin, Direct: 0.2 mg/dL (ref 0.0–0.3)
Total Bilirubin: 0.9 mg/dL (ref 0.2–1.2)
Total Protein: 6.8 g/dL (ref 6.0–8.3)

## 2015-07-28 LAB — BASIC METABOLIC PANEL
BUN: 20 mg/dL (ref 6–23)
CO2: 30 mEq/L (ref 19–32)
Calcium: 9.1 mg/dL (ref 8.4–10.5)
Chloride: 104 mEq/L (ref 96–112)
Creatinine, Ser: 1.23 mg/dL (ref 0.40–1.50)
GFR: 64.09 mL/min (ref >60.00)
Glucose, Bld: 87 mg/dL (ref 70–99)
Potassium: 4.2 mEq/L (ref 3.5–5.1)
Sodium: 143 mEq/L (ref 135–145)

## 2015-07-28 LAB — LIPID PANEL
Cholesterol: 130 mg/dL (ref 0–200)
HDL: 33.2 mg/dL — ABNORMAL LOW (ref >39.00)
LDL Cholesterol: 82 mg/dL (ref 0–99)
NonHDL: 96.96
Total CHOL/HDL Ratio: 4
Triglycerides: 76 mg/dL (ref 0.0–149.0)
VLDL: 15.2 mg/dL (ref 0.0–40.0)

## 2015-07-28 LAB — TSH: TSH: 1.38 u[IU]/mL (ref 0.35–4.50)

## 2015-08-04 ENCOUNTER — Telehealth: Payer: Self-pay | Admitting: Internal Medicine

## 2015-08-04 ENCOUNTER — Ambulatory Visit (INDEPENDENT_AMBULATORY_CARE_PROVIDER_SITE_OTHER): Payer: BLUE CROSS/BLUE SHIELD | Admitting: Internal Medicine

## 2015-08-04 ENCOUNTER — Encounter: Payer: Self-pay | Admitting: Internal Medicine

## 2015-08-04 VITALS — BP 128/84 | HR 64 | Temp 98.4°F | Ht 75.0 in | Wt 208.0 lb

## 2015-08-04 DIAGNOSIS — E785 Hyperlipidemia, unspecified: Secondary | ICD-10-CM | POA: Diagnosis not present

## 2015-08-04 DIAGNOSIS — Z0000 Encounter for general adult medical examination without abnormal findings: Secondary | ICD-10-CM

## 2015-08-04 DIAGNOSIS — I10 Essential (primary) hypertension: Secondary | ICD-10-CM | POA: Diagnosis not present

## 2015-08-04 MED ORDER — ATORVASTATIN CALCIUM 10 MG PO TABS: 10.0000 mg | ORAL_TABLET | Freq: Every day | ORAL | Status: DC

## 2015-08-04 MED ORDER — AMLODIPINE BESY-BENAZEPRIL HCL 5-20 MG PO CAPS: 1.0000 | ORAL_CAPSULE | Freq: Every day | ORAL | Status: DC

## 2015-08-04 NOTE — Progress Notes (Signed)
Pre visit review using our clinic review tool, if applicable. No additional management support is needed unless otherwise documented below in the visit note. 

## 2015-08-04 NOTE — Telephone Encounter (Signed)
Pt was in today and was given the prescriptions for amLODipine-benazepril (LOTREL) 5-20 MG capsule [[id number]]  And atorvastatin (LIPITOR) 10 MG tablet [[id number] and he was hoping you can just send them to [facility] in [place]  He is requesting a call back to let him know they were sent He can be reached at [phone]

## 2015-08-04 NOTE — Assessment & Plan Note (Signed)

## 2015-08-04 NOTE — Progress Notes (Signed)
Subjective:    Patient ID: Mathew Sanchez., male    DOB: Jul 27, 1955, 60 y.o.   MRN: [id number]  HPI  Here for wellness and f/u;  Overall doing ok;  Pt denies Chest pain, worsening SOB, DOE, wheezing, orthopnea, PND, worsening LE edema, palpitations, dizziness or syncope.  Pt denies neurological change such as new headache, facial or extremity weakness.  Pt denies polydipsia, polyuria, or low sugar symptoms. Pt states overall good compliance with treatment and medications, good tolerability, and has been trying to follow appropriate diet.  Pt denies worsening depressive symptoms, suicidal ideation or panic. No fever, night sweats, wt loss, loss of appetite, or other constitutional symptoms.  Pt states good ability with ADL's, has low fall risk, home safety reviewed and adequate, no other significant changes in hearing or vision, and only occasionally active with exercise.  No current complaints Past Medical History  Diagnosis Date  . HYPERLIPIDEMIA 07/29/2008  . ERECTILE DYSFUNCTION 07/29/2008  . HYPERTENSION 07/29/2008  . ALLERGIC RHINITIS 07/29/2008  . ACNE NEC 07/29/2008  . PSA, INCREASED 07/29/2008  . COLONIC POLYPS, HX OF 07/29/2008  . NEPHROLITHIASIS, HX OF 07/29/2008  . Chronic kidney disease     kidney stones   Past Surgical History  Procedure Laterality Date  . Tonsillectomy    . Colonoscopy    . Polypectomy      reports that he has never smoked. He has never used smokeless tobacco. He reports that he drinks alcohol. He reports that he does not use illicit drugs. family history is negative for Colon cancer, Rectal cancer, Stomach cancer, and Esophageal cancer. Allergies  Allergen Reactions  . Sildenafil Citrate Other (See Comments)    Make joints ache but can still take it   Current Outpatient Prescriptions on File Prior to Visit  Medication Sig Dispense Refill  . aspirin 81 MG EC tablet Take 81 mg by mouth daily.      . sildenafil (REVATIO) 20 MG tablet Take 3-5 tabs by  mouth every other day as needed 60 tablet 5  . sildenafil (VIAGRA) 100 MG tablet Take 0.5-1 tablets (50-100 mg total) by mouth daily as needed for erectile dysfunction. 5 tablet 11   No current facility-administered medications on file prior to visit.   Review of Systems Constitutional: Negative for increased diaphoresis, other activity, appetite or siginficant weight change other than noted HENT: Negative for worsening hearing loss, ear pain, facial swelling, mouth sores and neck stiffness.   Eyes: Negative for other worsening pain, redness or visual disturbance.  Respiratory: Negative for shortness of breath and wheezing  Cardiovascular: Negative for chest pain and palpitations.  Gastrointestinal: Negative for diarrhea, blood in stool, abdominal distention or other pain Genitourinary: Negative for hematuria, flank pain or change in urine volume.  Musculoskeletal: Negative for myalgias or other joint complaints.  Skin: Negative for color change and wound or drainage.  Neurological: Negative for syncope and numbness. other than noted Hematological: Negative for adenopathy. or other swelling Psychiatric/Behavioral: Negative for hallucinations, SI, self-injury, decreased concentration or other worsening agitation.      Objective:   Physical Exam BP 128/84 mmHg  Pulse 64  Temp(Src) 98.4 F (36.9 C) (Oral)  Ht  (1.905 m)  Wt 208 lb (94.348 kg)  BMI 26.00 kg/m2  SpO2 95% VS noted,  Constitutional: Pt is oriented to person, place, and time. Appears well-developed and well-nourished, in no significant distress Head: Normocephalic and atraumatic.  Right Ear: External ear normal.  Left Ear: External ear  normal.  Nose: Nose normal.  Mouth/Throat: Oropharynx is clear and moist.  Eyes: Conjunctivae and EOM are normal. Pupils are equal, round, and reactive to light.  Neck: Normal range of motion. Neck supple. No JVD present. No tracheal deviation present or significant neck LA or  mass Cardiovascular: Normal rate, regular rhythm, normal heart sounds and intact distal pulses.   Pulmonary/Chest: Effort normal and breath sounds without rales or wheezing  Abdominal: Soft. Bowel sounds are normal. NT. No HSM  Musculoskeletal: Normal range of motion. Exhibits no edema.  Lymphadenopathy:  Has no cervical adenopathy.  Neurological: Pt is alert and oriented to person, place, and time. Pt has normal reflexes. No cranial nerve deficit. Motor grossly intact Skin: Skin is warm and dry. No rash noted.  Psychiatric:  Has normal mood and affect. Behavior is normal.     Assessment & Plan:

## 2015-08-04 NOTE — Telephone Encounter (Signed)
Pt advised that this specific pharmacy does not accept E-Rx. Rxs reprinted and faxed to [facility]. Pt advised of same

## 2015-08-04 NOTE — Patient Instructions (Addendum)
Please continue all other medications as before, and refills have been done if requested.  Please have the pharmacy call with any other refills you may need.  Please continue your efforts at being more active, low cholesterol diet, and weight control.  You are otherwise up to date with prevention measures today.  Please keep your appointments with your specialists as you may have planned  Please go to the LAB in the Basement (turn left off the elevator) for the tests to be done at hour convenience, or the test will be done at your  next visit  Please return in 1 year for your yearly visit, or sooner if needed, with Lab testing done 3-5 days before

## 2015-08-05 NOTE — Assessment & Plan Note (Signed)
stable overall by history and exam, recent data reviewed with pt, and pt to continue medical treatment as before,  to f/u any worsening symptoms or concerns BP Readings from Last 3 Encounters:  08/04/15 128/84  07/28/14 130/80  06/07/13 114/73

## 2016-04-26 DIAGNOSIS — Z23 Encounter for immunization: Secondary | ICD-10-CM | POA: Diagnosis not present

## 2016-07-30 ENCOUNTER — Other Ambulatory Visit (INDEPENDENT_AMBULATORY_CARE_PROVIDER_SITE_OTHER): Payer: BLUE CROSS/BLUE SHIELD

## 2016-07-30 DIAGNOSIS — Z0000 Encounter for general adult medical examination without abnormal findings: Secondary | ICD-10-CM | POA: Diagnosis not present

## 2016-07-30 LAB — CBC WITH DIFFERENTIAL/PLATELET
Basophils Absolute: 0 10*3/uL (ref 0.0–0.1)
Basophils Relative: 0.3 % (ref 0.0–3.0)
Eosinophils Absolute: 0.1 10*3/uL (ref 0.0–0.7)
Eosinophils Relative: 1.5 % (ref 0.0–5.0)
HCT: 41.2 % (ref 39.0–52.0)
Hemoglobin: 14.2 g/dL (ref 13.0–17.0)
Lymphocytes Relative: 18.4 % (ref 12.0–46.0)
Lymphs Abs: 1.4 10*3/uL (ref 0.7–4.0)
MCHC: 34.4 g/dL (ref 30.0–36.0)
MCV: 92 fl (ref 78.0–100.0)
Monocytes Absolute: 0.8 10*3/uL (ref 0.1–1.0)
Monocytes Relative: 9.9 % (ref 3.0–12.0)
Neutro Abs: 5.3 10*3/uL (ref 1.4–7.7)
Neutrophils Relative %: 69.9 % (ref 43.0–77.0)
Platelets: 257 10*3/uL (ref 150.0–400.0)
RBC: 4.48 Mil/uL (ref 4.22–5.81)
RDW: 12.9 % (ref 11.5–15.5)
WBC: 7.6 10*3/uL (ref 4.0–10.5)

## 2016-07-30 LAB — BASIC METABOLIC PANEL
BUN: 17 mg/dL (ref 6–23)
CO2: 30 mEq/L (ref 19–32)
Calcium: 9.1 mg/dL (ref 8.4–10.5)
Chloride: 105 mEq/L (ref 96–112)
Creatinine, Ser: 1.25 mg/dL (ref 0.40–1.50)
GFR: 62.69 mL/min (ref >60.00)
Glucose, Bld: 87 mg/dL (ref 70–99)
Potassium: 4.8 mEq/L (ref 3.5–5.1)
Sodium: 142 mEq/L (ref 135–145)

## 2016-07-30 LAB — HEPATIC FUNCTION PANEL
ALT: 22 U/L (ref 0–53)
AST: 16 U/L (ref 0–37)
Albumin: 3.9 g/dL (ref 3.5–5.2)
Alkaline Phosphatase: 62 U/L (ref 39–117)
Bilirubin, Direct: 0.2 mg/dL (ref 0.0–0.3)
Total Bilirubin: 0.9 mg/dL (ref 0.2–1.2)
Total Protein: 6.4 g/dL (ref 6.0–8.3)

## 2016-07-30 LAB — LIPID PANEL
Cholesterol: 137 mg/dL (ref 0–200)
HDL: 35.2 mg/dL — ABNORMAL LOW (ref >39.00)
LDL Cholesterol: 86 mg/dL (ref 0–99)
NonHDL: 101.91
Total CHOL/HDL Ratio: 4
Triglycerides: 80 mg/dL (ref 0.0–149.0)
VLDL: 16 mg/dL (ref 0.0–40.0)

## 2016-07-30 LAB — URINALYSIS, ROUTINE W REFLEX MICROSCOPIC
Bilirubin Urine: NEGATIVE
Hgb urine dipstick: NEGATIVE
Ketones, ur: NEGATIVE
Leukocytes, UA: NEGATIVE
Nitrite: NEGATIVE
RBC / HPF: NONE SEEN (ref 0–?)
Specific Gravity, Urine: 1.025 (ref 1.000–1.030)
Total Protein, Urine: 100 — AB
Urine Glucose: NEGATIVE
Urobilinogen, UA: 0.2 (ref 0.0–1.0)
WBC, UA: NONE SEEN (ref 0–?)
pH: 6 (ref 5.0–8.0)

## 2016-07-30 LAB — TSH: TSH: 1.14 u[IU]/mL (ref 0.35–4.50)

## 2016-07-30 LAB — PSA: PSA: 1.92 ng/mL (ref 0.10–4.00)

## 2016-07-31 LAB — HEPATITIS C ANTIBODY: HCV Ab: NEGATIVE

## 2016-08-27 ENCOUNTER — Encounter: Payer: BLUE CROSS/BLUE SHIELD | Admitting: Internal Medicine

## 2016-09-17 ENCOUNTER — Ambulatory Visit (INDEPENDENT_AMBULATORY_CARE_PROVIDER_SITE_OTHER): Payer: BLUE CROSS/BLUE SHIELD | Admitting: Internal Medicine

## 2016-09-17 ENCOUNTER — Encounter: Payer: Self-pay | Admitting: Internal Medicine

## 2016-09-17 VITALS — BP 128/82 | HR 68 | Temp 97.6°F | Ht 75.0 in | Wt 206.0 lb

## 2016-09-17 DIAGNOSIS — E785 Hyperlipidemia, unspecified: Secondary | ICD-10-CM | POA: Diagnosis not present

## 2016-09-17 DIAGNOSIS — M545 Low back pain, unspecified: Secondary | ICD-10-CM

## 2016-09-17 DIAGNOSIS — Z0000 Encounter for general adult medical examination without abnormal findings: Secondary | ICD-10-CM

## 2016-09-17 DIAGNOSIS — I10 Essential (primary) hypertension: Secondary | ICD-10-CM | POA: Diagnosis not present

## 2016-09-17 MED ORDER — AMLODIPINE BESY-BENAZEPRIL HCL 5-20 MG PO CAPS: 1.0000 | ORAL_CAPSULE | Freq: Every day | ORAL | 3 refills | Status: DC

## 2016-09-17 MED ORDER — ATORVASTATIN CALCIUM 10 MG PO TABS: 10.0000 mg | ORAL_TABLET | Freq: Every day | ORAL | 3 refills | Status: DC

## 2016-09-17 NOTE — Patient Instructions (Signed)
Please continue all other medications as before, and refills have been done if requested.  Please have the pharmacy call with any other refills you may need.  Please continue your efforts at being more active, low cholesterol diet, and weight control.  You are otherwise up to date with prevention measures today.  Please keep your appointments with your specialists as you may have planned  Please return in 1 year for your yearly visit, or sooner if needed, with Lab testing done 3-5 days before  

## 2016-09-17 NOTE — Progress Notes (Signed)
Subjective:    Patient ID: Mathew Ramusavidson H Brazil Jr., male    DOB: November 03, 1955, 61 y.o.   MRN: [id number]  HPI  Here for wellness and f/u;  Overall doing ok;  Pt denies Chest pain, worsening SOB, DOE, wheezing, orthopnea, PND, worsening LE edema, palpitations, dizziness or syncope.  Pt denies neurological change such as new headache, facial or extremity weakness.  Pt denies polydipsia, polyuria, or low sugar symptoms. Pt states overall good compliance with treatment and medications, good tolerability, and has been trying to follow appropriate diet.  Pt denies worsening depressive symptoms, suicidal ideation or panic. No fever, night sweats, wt loss, loss of appetite, or other constitutional symptoms.  Pt states good ability with ADL's, has low fall risk, home safety reviewed and adequate, no other significant changes in hearing or vision, and only occasionally active with exercise. No other history except does have left buttock aching type pain worse at night and prolonged standing, mild intermittent and o/w no radicular symtpoms Wt Readings from Last 3 Encounters:  09/17/16 206 lb (93.4 kg)  08/04/15 208 lb (94.3 kg)  07/28/14 207 lb 4 oz (94 kg)  Son has Hep C Past Medical History:  Diagnosis Date  . ACNE NEC 07/29/2008  . ALLERGIC RHINITIS 07/29/2008  . Chronic kidney disease    kidney stones  . COLONIC POLYPS, HX OF 07/29/2008  . ERECTILE DYSFUNCTION 07/29/2008  . HYPERLIPIDEMIA 07/29/2008  . HYPERTENSION 07/29/2008  . NEPHROLITHIASIS, HX OF 07/29/2008  . PSA, INCREASED 07/29/2008   Past Surgical History:  Procedure Laterality Date  . COLONOSCOPY    . POLYPECTOMY    . TONSILLECTOMY      reports that he has never smoked. He has never used smokeless tobacco. He reports that he drinks alcohol. He reports that he does not use drugs. family history is not on file. Allergies  Allergen Reactions  . Sildenafil Citrate Other (See Comments)    Make joints ache but can still take it   Current  Outpatient Prescriptions on File Prior to Visit  Medication Sig Dispense Refill  . aspirin 81 MG EC tablet Take 81 mg by mouth daily.       No current facility-administered medications on file prior to visit.     Review of Systems Constitutional: Negative for increased diaphoresis, or other activity, appetite or siginficant weight change other than noted HENT: Negative for worsening hearing loss, ear pain, facial swelling, mouth sores and neck stiffness.   Eyes: Negative for other worsening pain, redness or visual disturbance.  Respiratory: Negative for choking or stridor Cardiovascular: Negative for other chest pain and palpitations.  Gastrointestinal: Negative for worsening diarrhea, blood in stool, or abdominal distention Genitourinary: Negative for hematuria, flank pain or change in urine volume.  Musculoskeletal: Negative for myalgias or other joint complaints.  Skin: Negative for other color change and wound or drainage.  Neurological: Negative for syncope and numbness. other than noted Hematological: Negative for adenopathy. or other swelling Psychiatric/Behavioral: Negative for hallucinations, SI, self-injury, decreased concentration or other worsening agitation.  All other system neg per pt    Objective:   Physical Exam BP 128/82   Pulse 68   Temp 97.6 F (36.4 C)   Ht 6\' 3"  (1.905 m)   Wt 206 lb (93.4 kg)   SpO2 97%   BMI 25.75 kg/m  VS noted,  Constitutional: Pt is oriented to person, place, and time. Appears well-developed and well-nourished, in no significant distress Head: Normocephalic and atraumatic  Eyes: Conjunctivae  and EOM are normal. Pupils are equal, round, and reactive to light Right Ear: External ear normal.  Left Ear: External ear normal Nose: Nose normal.  Mouth/Throat: Oropharynx is clear and moist  Neck: Normal range of motion. Neck supple. No JVD present. No tracheal deviation present or significant neck LA or mass Cardiovascular: Normal rate,  regular rhythm, normal heart sounds and intact distal pulses.   Pulmonary/Chest: Effort normal and breath sounds without rales or wheezing  Abdominal: Soft. Bowel sounds are normal. NT. No HSM  Musculoskeletal: Normal range of motion. Exhibits no edema Lymphadenopathy: Has no cervical adenopathy.  Neurological: Pt is alert and oriented to person, place, and time. Pt has normal reflexes. No cranial nerve deficit. Motor grossly intact Skin: Skin is warm and dry. No rash noted or new ulcers Psychiatric:  Has normal mood and affect. Behavior is normal.  No other exam findings   ECG today I have personally interpreted Sinus  Rhythm  WITHIN NORMAL LIMITS    Assessment & Plan:

## 2016-09-22 DIAGNOSIS — M549 Dorsalgia, unspecified: Secondary | ICD-10-CM | POA: Insufficient documentation

## 2016-09-22 NOTE — Assessment & Plan Note (Signed)
stable overall by history and exam, recent data reviewed with pt, and pt to continue medical treatment as before,  to f/u any worsening symptoms or concerns BP Readings from Last 3 Encounters:  09/17/16 128/82  08/04/15 128/84  07/28/14 130/80

## 2016-09-22 NOTE — Assessment & Plan Note (Signed)
Mild intermittent, exam benign, consider sport med referral if persists

## 2016-09-22 NOTE — Assessment & Plan Note (Signed)

## 2016-11-25 DIAGNOSIS — Z713 Dietary counseling and surveillance: Secondary | ICD-10-CM | POA: Diagnosis not present

## 2016-11-28 DIAGNOSIS — L570 Actinic keratosis: Secondary | ICD-10-CM | POA: Diagnosis not present

## 2017-03-07 ENCOUNTER — Telehealth: Payer: Self-pay | Admitting: Internal Medicine

## 2017-03-07 DIAGNOSIS — E785 Hyperlipidemia, unspecified: Secondary | ICD-10-CM

## 2017-03-07 MED ORDER — AMLODIPINE BESY-BENAZEPRIL HCL 5-20 MG PO CAPS: 1.0000 | ORAL_CAPSULE | Freq: Every day | ORAL | 1 refills | Status: DC

## 2017-03-07 MED ORDER — ATORVASTATIN CALCIUM 10 MG PO TABS: 10.0000 mg | ORAL_TABLET | Freq: Every day | ORAL | 1 refills | Status: DC

## 2017-03-07 NOTE — Telephone Encounter (Signed)
Patient requesting scripts on atorvastatin and amlodipine.  Patient does have existing scripts located at [facility].  Patient is wanting to get scripts through [facility] and can not get scripts transferred from [facility].   Patient is requesting to pick scripts up b/c he is not sure what pharmacy will process through [facility]. Patient is requesting both scripts to have 90 day refills.

## 2017-03-07 NOTE — Telephone Encounter (Signed)
Reviewed chart pt is up-to-date inform pt he can pick rx up at the front desk...Raechel Chute

## 2017-05-14 DIAGNOSIS — Z23 Encounter for immunization: Secondary | ICD-10-CM | POA: Diagnosis not present

## 2017-10-13 DIAGNOSIS — Z713 Dietary counseling and surveillance: Secondary | ICD-10-CM | POA: Diagnosis not present

## 2017-12-16 ENCOUNTER — Other Ambulatory Visit: Payer: Self-pay | Admitting: Internal Medicine

## 2017-12-19 ENCOUNTER — Telehealth: Payer: Self-pay | Admitting: Internal Medicine

## 2017-12-19 NOTE — Telephone Encounter (Signed)
error 

## 2017-12-19 NOTE — Telephone Encounter (Signed)
Copied from CRM [id number]. Topic: General - Other >> Dec 19, 2017  8:30 AM Gerrianne ScalePayne, Angela L wrote: Reason for CRM: pt stating that he would like for Dr Jonny RuizJohn to give him a call he's not making an appt for a med refill on his BP medicine and that he will switch providers because he has to make an appt for denial of medicine because he needs a OV

## 2017-12-19 NOTE — Telephone Encounter (Signed)
I have followed up with patient in regard.  I did notify patient that his last CPE was March of 2018 and that he would need to come in at least once a year to keep his medications going.   Patient seemed a little agitated by this.  Patient wanted to know why we did not contact him to schedule his CPE.  I did inform patient that his follow up dates are always listed on his AVS as part of his instructions and that he can either schedule these appointments before he leaves the office or call us at a later time that is better for him to schedule. Patient did schedule CPE with intent of coming in the week prior for labs.  We were going to request a med refill of his BP medication up to that date.   Patient then informed me that his insurance only covers certain labs and that the last time he had a CPE he had labs drawn that his insurance company did not consider routine.  I informed patient that he could get with his insurance company to find out what they do cover under a CPE and he did not have to draw the ones not covered if that was what he wanted to do.  Patient then was upset stating that checking insurance benefits should be part of my job.  I informed patient that unfortunately we do not check patients insurance benefits.   Patient was then further upset and wanted to cancel his appointment stating that he has been unhappy with his provider and that he is unhappy being a patient of [facility].   I have cancelled the appointment and removed provider as PCP in Chart.

## 2017-12-22 ENCOUNTER — Telehealth: Payer: Self-pay | Admitting: Internal Medicine

## 2017-12-22 MED ORDER — AMLODIPINE BESY-BENAZEPRIL HCL 5-20 MG PO CAPS: 1.0000 | ORAL_CAPSULE | Freq: Every day | ORAL | 0 refills | Status: DC

## 2017-12-22 NOTE — Telephone Encounter (Signed)
Per office policy sent 30 day to local pharmacy until appt.../lmb  

## 2017-12-22 NOTE — Telephone Encounter (Signed)
Copied from CRM [id number]. Topic: Quick Communication - Rx Refill/Question >> Dec 22, 2017 10:30 AM [name], Mathew Sanchez wrote: Medication:amlodipine Has the patient contacted their pharmacy? Yes. Pt has an cpx schedule for 12-31-17 (Agent: If no, request that the patient contact the pharmacy for the refill.) (Agent: If yes, when and what did the pharmacy advise?)  Preferred Pharmacy (with phone number or street name): [facility] Agent: Please be advised that RX refills may take up to 3 business days. We ask that you follow-up with your pharmacy.

## 2017-12-24 ENCOUNTER — Encounter: Payer: BLUE CROSS/BLUE SHIELD | Admitting: Internal Medicine

## 2017-12-29 ENCOUNTER — Encounter: Payer: BLUE CROSS/BLUE SHIELD | Admitting: Internal Medicine

## 2017-12-29 ENCOUNTER — Other Ambulatory Visit: Payer: BLUE CROSS/BLUE SHIELD

## 2017-12-31 ENCOUNTER — Ambulatory Visit (INDEPENDENT_AMBULATORY_CARE_PROVIDER_SITE_OTHER): Payer: BLUE CROSS/BLUE SHIELD | Admitting: Internal Medicine

## 2017-12-31 ENCOUNTER — Encounter: Payer: Self-pay | Admitting: Internal Medicine

## 2017-12-31 VITALS — BP 124/76 | HR 67 | Temp 98.6°F | Ht 75.0 in | Wt 207.0 lb

## 2017-12-31 DIAGNOSIS — E785 Hyperlipidemia, unspecified: Secondary | ICD-10-CM

## 2017-12-31 DIAGNOSIS — Z114 Encounter for screening for human immunodeficiency virus [HIV]: Secondary | ICD-10-CM | POA: Diagnosis not present

## 2017-12-31 DIAGNOSIS — I10 Essential (primary) hypertension: Secondary | ICD-10-CM | POA: Diagnosis not present

## 2017-12-31 DIAGNOSIS — Z0000 Encounter for general adult medical examination without abnormal findings: Secondary | ICD-10-CM

## 2017-12-31 MED ORDER — ATORVASTATIN CALCIUM 10 MG PO TABS: 10.0000 mg | ORAL_TABLET | Freq: Every day | ORAL | 3 refills | Status: DC

## 2017-12-31 MED ORDER — AMLODIPINE BESY-BENAZEPRIL HCL 5-20 MG PO CAPS: 1.0000 | ORAL_CAPSULE | Freq: Every day | ORAL | 3 refills | Status: DC

## 2017-12-31 NOTE — Patient Instructions (Signed)

## 2017-12-31 NOTE — Assessment & Plan Note (Signed)

## 2017-12-31 NOTE — Progress Notes (Signed)
Subjective:    Patient ID: Mathew Ramus., male    DOB: 02-May-1956, 62 y.o.   MRN: [id number]  HPI  Here for wellness and f/u;  Overall doing ok;  Pt denies Chest pain, worsening SOB, DOE, wheezing, orthopnea, PND, worsening LE edema, palpitations, dizziness or syncope.  Pt denies neurological change such as new headache, facial or extremity weakness.  Pt denies polydipsia, polyuria, or low sugar symptoms. Pt states overall good compliance with treatment and medications, good tolerability, and has been trying to follow appropriate diet.  Pt denies worsening depressive symptoms, suicidal ideation or panic. No fever, night sweats, wt loss, loss of appetite, or other constitutional symptoms.  Pt states good ability with ADL's, has low fall risk, home safety reviewed and adequate, no other significant changes in hearing or vision, and active with exercise, with walking up to 2.5 miles per day, strethcing exercise and light wts at home.  Wt Readings from Last 3 Encounters:  12/31/17 207 lb (93.9 kg)  09/17/16 206 lb (93.4 kg)  08/04/15 208 lb (94.3 kg)  Has known varicosities to legs but not really hurting except for pain from lower back and knees, better with alleve.    Has quite a bit of stress over 55 yo son with heroin addiction ongoing.   Past Medical History:  Diagnosis Date  . ACNE NEC 07/29/2008  . ALLERGIC RHINITIS 07/29/2008  . Chronic kidney disease    kidney stones  . COLONIC POLYPS, HX OF 07/29/2008  . ERECTILE DYSFUNCTION 07/29/2008  . HYPERLIPIDEMIA 07/29/2008  . HYPERTENSION 07/29/2008  . NEPHROLITHIASIS, HX OF 07/29/2008  . PSA, INCREASED 07/29/2008   Past Surgical History:  Procedure Laterality Date  . COLONOSCOPY    . POLYPECTOMY    . TONSILLECTOMY      reports that he has never smoked. He has never used smokeless tobacco. He reports that he drinks alcohol. He reports that he does not use drugs. family history is not on file. Allergies  Allergen Reactions  .  Sildenafil Citrate Other (See Comments)    Make joints ache but can still take it   Current Outpatient Medications on File Prior to Visit  Medication Sig Dispense Refill  . aspirin 81 MG EC tablet Take 81 mg by mouth daily.       No current facility-administered medications on file prior to visit.    Review of Systems Constitutional: Negative for other unusual diaphoresis, sweats, appetite or weight changes HENT: Negative for other worsening hearing loss, ear pain, facial swelling, mouth sores or neck stiffness.   Eyes: Negative for other worsening pain, redness or other visual disturbance.  Respiratory: Negative for other stridor or swelling Cardiovascular: Negative for other palpitations or other chest pain  Gastrointestinal: Negative for worsening diarrhea or loose stools, blood in stool, distention or other pain Genitourinary: Negative for hematuria, flank pain or other change in urine volume.  Musculoskeletal: Negative for myalgias or other joint swelling.  Skin: Negative for other color change, or other wound or worsening drainage.  Neurological: Negative for other syncope or numbness. Hematological: Negative for other adenopathy or swelling Psychiatric/Behavioral: Negative for hallucinations, other worsening agitation, SI, self-injury, or new decreased concentration All other system neg per pt    Objective:   Physical Exam BP 124/76   Pulse 67   Temp 98.6 F (37 C) (Oral)   Ht 6\' 3"  (1.905 m)   Wt 207 lb (93.9 kg)   SpO2 96%   BMI 25.87 kg/m  VS noted,  Constitutional: Pt is oriented to person, place, and time. Appears well-developed and well-nourished, in no significant distress and comfortable Head: Normocephalic and atraumatic  Eyes: Conjunctivae and EOM are normal. Pupils are equal, round, and reactive to light Right Ear: External ear normal without discharge Left Ear: External ear normal without discharge Nose: Nose without discharge or deformity Mouth/Throat:  Oropharynx is without other ulcerations and moist  Neck: Normal range of motion. Neck supple. No JVD present. No tracheal deviation present or significant neck LA or mass Cardiovascular: Normal rate, regular rhythm, normal heart sounds and intact distal pulses. Pulmonary/Chest: WOB normal and breath sounds without rales or wheezing  Abdominal: Soft. Bowel sounds are normal. NT. No HSM  Musculoskeletal: Normal range of motion. Exhibits no edema except for decreased internal rotation left hip with pain Lymphadenopathy: Has no other cervical adenopathy.  Neurological: Pt is alert and oriented to person, place, and time. Pt has normal reflexes. No cranial nerve deficit. Motor grossly intact, Gait intact Skin: Skin is warm and dry. No rash noted or new ulcerations Psychiatric:  Has tense mild irritable mood and affect. Behavior is normal without agitation No other exam findings  Lab Results  Component Value Date   WBC 7.6 07/30/2016   HGB 14.2 07/30/2016   HCT 41.2 07/30/2016   PLT 257.0 07/30/2016   GLUCOSE 87 07/30/2016   CHOL 137 07/30/2016   TRIG 80.0 07/30/2016   HDL 35.20 (L) 07/30/2016   LDLDIRECT 144.7 10/09/2010   LDLCALC 86 07/30/2016   ALT 22 07/30/2016   AST 16 07/30/2016   NA 142 07/30/2016   K 4.8 07/30/2016   CL 105 07/30/2016   CREATININE 1.25 07/30/2016   BUN 17 07/30/2016   CO2 30 07/30/2016   TSH 1.14 07/30/2016   PSA 1.92 07/30/2016        Assessment & Plan:

## 2017-12-31 NOTE — Assessment & Plan Note (Signed)
stable overall by history and exam, recent data reviewed with pt, and pt to continue medical treatment as before,  to f/u any worsening symptoms or concerns  

## 2018-01-01 ENCOUNTER — Other Ambulatory Visit (INDEPENDENT_AMBULATORY_CARE_PROVIDER_SITE_OTHER): Payer: BLUE CROSS/BLUE SHIELD

## 2018-01-01 DIAGNOSIS — Z0000 Encounter for general adult medical examination without abnormal findings: Secondary | ICD-10-CM | POA: Diagnosis not present

## 2018-01-01 DIAGNOSIS — Z114 Encounter for screening for human immunodeficiency virus [HIV]: Secondary | ICD-10-CM

## 2018-01-01 DIAGNOSIS — E785 Hyperlipidemia, unspecified: Secondary | ICD-10-CM | POA: Diagnosis not present

## 2018-01-01 LAB — CBC WITH DIFFERENTIAL/PLATELET
Basophils Absolute: 0 10*3/uL (ref 0.0–0.1)
Basophils Relative: 0.6 % (ref 0.0–3.0)
Eosinophils Absolute: 0.1 10*3/uL (ref 0.0–0.7)
Eosinophils Relative: 2.1 % (ref 0.0–5.0)
HCT: 42.3 % (ref 39.0–52.0)
Hemoglobin: 14.6 g/dL (ref 13.0–17.0)
Lymphocytes Relative: 27.7 % (ref 12.0–46.0)
Lymphs Abs: 1.3 10*3/uL (ref 0.7–4.0)
MCHC: 34.5 g/dL (ref 30.0–36.0)
MCV: 92.7 fl (ref 78.0–100.0)
Monocytes Absolute: 0.6 10*3/uL (ref 0.1–1.0)
Monocytes Relative: 11.9 % (ref 3.0–12.0)
Neutro Abs: 2.7 10*3/uL (ref 1.4–7.7)
Neutrophils Relative %: 57.7 % (ref 43.0–77.0)
Platelets: 192 10*3/uL (ref 150.0–400.0)
RBC: 4.57 Mil/uL (ref 4.22–5.81)
RDW: 12.9 % (ref 11.5–15.5)
WBC: 4.7 10*3/uL (ref 4.0–10.5)

## 2018-01-01 LAB — BASIC METABOLIC PANEL
BUN: 24 mg/dL — ABNORMAL HIGH (ref 6–23)
CO2: 29 mEq/L (ref 19–32)
Calcium: 9.1 mg/dL (ref 8.4–10.5)
Chloride: 105 mEq/L (ref 96–112)
Creatinine, Ser: 1.24 mg/dL (ref 0.40–1.50)
GFR: 62.97 mL/min (ref >60.00)
Glucose, Bld: 81 mg/dL (ref 70–99)
Potassium: 4.3 mEq/L (ref 3.5–5.1)
Sodium: 142 mEq/L (ref 135–145)

## 2018-01-01 LAB — URINALYSIS, ROUTINE W REFLEX MICROSCOPIC
Bilirubin Urine: NEGATIVE
Hgb urine dipstick: NEGATIVE
Ketones, ur: NEGATIVE
Leukocytes, UA: NEGATIVE
Nitrite: NEGATIVE
Specific Gravity, Urine: 1.025 (ref 1.000–1.030)
Total Protein, Urine: 30 — AB
Urine Glucose: NEGATIVE
Urobilinogen, UA: 0.2 (ref 0.0–1.0)
pH: 6 (ref 5.0–8.0)

## 2018-01-01 LAB — HEPATIC FUNCTION PANEL
ALT: 14 U/L (ref 0–53)
AST: 16 U/L (ref 0–37)
Albumin: 4.2 g/dL (ref 3.5–5.2)
Alkaline Phosphatase: 73 U/L (ref 39–117)
Bilirubin, Direct: 0.2 mg/dL (ref 0.0–0.3)
Total Bilirubin: 0.9 mg/dL (ref 0.2–1.2)
Total Protein: 6.5 g/dL (ref 6.0–8.3)

## 2018-01-01 LAB — LIPID PANEL
Cholesterol: 132 mg/dL (ref 0–200)
HDL: 37.3 mg/dL — ABNORMAL LOW (ref >39.00)
LDL Cholesterol: 78 mg/dL (ref 0–99)
NonHDL: 95.16
Total CHOL/HDL Ratio: 4
Triglycerides: 85 mg/dL (ref 0.0–149.0)
VLDL: 17 mg/dL (ref 0.0–40.0)

## 2018-01-01 LAB — PSA: PSA: 2.18 ng/mL (ref 0.10–4.00)

## 2018-01-01 LAB — TSH: TSH: 2.09 u[IU]/mL (ref 0.35–4.50)

## 2018-01-02 LAB — HIV ANTIBODY (ROUTINE TESTING W REFLEX): HIV 1&2 Ab, 4th Generation: NONREACTIVE

## 2018-03-24 DIAGNOSIS — Z23 Encounter for immunization: Secondary | ICD-10-CM | POA: Diagnosis not present

## 2018-12-11 ENCOUNTER — Other Ambulatory Visit: Payer: Self-pay | Admitting: Internal Medicine

## 2018-12-23 ENCOUNTER — Other Ambulatory Visit: Payer: Self-pay | Admitting: Internal Medicine

## 2019-01-04 ENCOUNTER — Encounter: Payer: BLUE CROSS/BLUE SHIELD | Admitting: Internal Medicine

## 2019-03-09 ENCOUNTER — Telehealth: Payer: Self-pay

## 2019-03-09 DIAGNOSIS — Z0000 Encounter for general adult medical examination without abnormal findings: Secondary | ICD-10-CM

## 2019-03-09 NOTE — Telephone Encounter (Signed)
CPE labs ordered 

## 2019-03-30 ENCOUNTER — Other Ambulatory Visit (INDEPENDENT_AMBULATORY_CARE_PROVIDER_SITE_OTHER): Payer: BC Managed Care – PPO

## 2019-03-30 DIAGNOSIS — Z0000 Encounter for general adult medical examination without abnormal findings: Secondary | ICD-10-CM

## 2019-03-30 LAB — CBC WITH DIFFERENTIAL/PLATELET
Basophils Absolute: 0.1 10*3/uL (ref 0.0–0.1)
Basophils Relative: 0.9 % (ref 0.0–3.0)
Eosinophils Absolute: 0.1 10*3/uL (ref 0.0–0.7)
Eosinophils Relative: 2.2 % (ref 0.0–5.0)
HCT: 42.9 % (ref 39.0–52.0)
Hemoglobin: 14.6 g/dL (ref 13.0–17.0)
Lymphocytes Relative: 29.2 % (ref 12.0–46.0)
Lymphs Abs: 1.6 10*3/uL (ref 0.7–4.0)
MCHC: 34.1 g/dL (ref 30.0–36.0)
MCV: 94.4 fl (ref 78.0–100.0)
Monocytes Absolute: 0.6 10*3/uL (ref 0.1–1.0)
Monocytes Relative: 11.2 % (ref 3.0–12.0)
Neutro Abs: 3.2 10*3/uL (ref 1.4–7.7)
Neutrophils Relative %: 56.5 % (ref 43.0–77.0)
Platelets: 186 10*3/uL (ref 150.0–400.0)
RBC: 4.54 Mil/uL (ref 4.22–5.81)
RDW: 13 % (ref 11.5–15.5)
WBC: 5.6 10*3/uL (ref 4.0–10.5)

## 2019-03-30 LAB — LIPID PANEL
Cholesterol: 149 mg/dL (ref 0–200)
HDL: 38.3 mg/dL — ABNORMAL LOW (ref >39.00)
LDL Cholesterol: 91 mg/dL (ref 0–99)
NonHDL: 110.88
Total CHOL/HDL Ratio: 4
Triglycerides: 99 mg/dL (ref 0.0–149.0)
VLDL: 19.8 mg/dL (ref 0.0–40.0)

## 2019-03-30 LAB — HEPATIC FUNCTION PANEL
ALT: 15 U/L (ref 0–53)
AST: 18 U/L (ref 0–37)
Albumin: 4.2 g/dL (ref 3.5–5.2)
Alkaline Phosphatase: 68 U/L (ref 39–117)
Bilirubin, Direct: 0.1 mg/dL (ref 0.0–0.3)
Total Bilirubin: 0.9 mg/dL (ref 0.2–1.2)
Total Protein: 6.4 g/dL (ref 6.0–8.3)

## 2019-03-30 LAB — URINALYSIS, ROUTINE W REFLEX MICROSCOPIC
Bilirubin Urine: NEGATIVE
Hgb urine dipstick: NEGATIVE
Ketones, ur: NEGATIVE
Leukocytes,Ua: NEGATIVE
Nitrite: NEGATIVE
RBC / HPF: NONE SEEN (ref 0–?)
Specific Gravity, Urine: 1.025 (ref 1.000–1.030)
Total Protein, Urine: NEGATIVE
Urine Glucose: NEGATIVE
Urobilinogen, UA: 0.2 (ref 0.0–1.0)
pH: 5.5 (ref 5.0–8.0)

## 2019-03-30 LAB — BASIC METABOLIC PANEL
BUN: 21 mg/dL (ref 6–23)
CO2: 28 mEq/L (ref 19–32)
Calcium: 9.2 mg/dL (ref 8.4–10.5)
Chloride: 106 mEq/L (ref 96–112)
Creatinine, Ser: 1.37 mg/dL (ref 0.40–1.50)
GFR: 52.59 mL/min — ABNORMAL LOW (ref >60.00)
Glucose, Bld: 94 mg/dL (ref 70–99)
Potassium: 4.4 mEq/L (ref 3.5–5.1)
Sodium: 141 mEq/L (ref 135–145)

## 2019-03-30 LAB — TSH: TSH: 1.8 u[IU]/mL (ref 0.35–4.50)

## 2019-03-30 LAB — PSA: PSA: 2.08 ng/mL (ref 0.10–4.00)

## 2019-04-08 ENCOUNTER — Ambulatory Visit (INDEPENDENT_AMBULATORY_CARE_PROVIDER_SITE_OTHER): Payer: BC Managed Care – PPO | Admitting: Internal Medicine

## 2019-04-08 ENCOUNTER — Encounter: Payer: Self-pay | Admitting: Internal Medicine

## 2019-04-08 ENCOUNTER — Other Ambulatory Visit: Payer: Self-pay

## 2019-04-08 VITALS — BP 122/78 | HR 64 | Temp 98.4°F | Ht 75.0 in | Wt 203.0 lb

## 2019-04-08 DIAGNOSIS — Z0000 Encounter for general adult medical examination without abnormal findings: Secondary | ICD-10-CM | POA: Diagnosis not present

## 2019-04-08 DIAGNOSIS — M533 Sacrococcygeal disorders, not elsewhere classified: Secondary | ICD-10-CM | POA: Diagnosis not present

## 2019-04-08 DIAGNOSIS — E538 Deficiency of other specified B group vitamins: Secondary | ICD-10-CM

## 2019-04-08 DIAGNOSIS — E559 Vitamin D deficiency, unspecified: Secondary | ICD-10-CM

## 2019-04-08 DIAGNOSIS — E785 Hyperlipidemia, unspecified: Secondary | ICD-10-CM | POA: Diagnosis not present

## 2019-04-08 DIAGNOSIS — E611 Iron deficiency: Secondary | ICD-10-CM

## 2019-04-08 MED ORDER — ZOSTER VAC RECOMB ADJUVANTED 50 MCG/0.5ML IM SUSR: 0.5000 mL | Freq: Once | INTRAMUSCULAR | 1 refills | Status: AC

## 2019-04-08 MED ORDER — AMLODIPINE BESY-BENAZEPRIL HCL 5-20 MG PO CAPS: 1.0000 | ORAL_CAPSULE | Freq: Every day | ORAL | 3 refills | Status: DC

## 2019-04-08 MED ORDER — ATORVASTATIN CALCIUM 10 MG PO TABS: 10.0000 mg | ORAL_TABLET | Freq: Every day | ORAL | 3 refills | Status: DC

## 2019-04-08 NOTE — Progress Notes (Signed)
Subjective:    Patient ID: Mathew Sanchez., male    DOB: 12-01-1955, 63 y.o.   MRN: [id number]  HPI   Here for wellness and f/u;  Overall doing ok;  Pt denies Chest pain, worsening SOB, DOE, wheezing, orthopnea, PND, worsening LE edema, palpitations, dizziness or syncope.  Pt denies neurological change such as new headache, facial or extremity weakness.  Pt denies polydipsia, polyuria, or low sugar symptoms. Pt states overall good compliance with treatment and medications, good tolerability, and has been trying to follow appropriate diet.  Pt denies worsening depressive symptoms, suicidal ideation or panic. No fever, night sweats, wt loss, loss of appetite, or other constitutional symptoms.  Pt states good ability with ADL's, has low fall risk, home safety reviewed and adequate, no other significant changes in hearing or vision, and only occasionally active with exercise Wt Readings from Last 3 Encounters:  04/08/19 203 lb (92.1 kg)  12/31/17 207 lb (93.9 kg)  09/17/16 206 lb (93.4 kg)  Pt plans to call for colonoscopy f/u.  Pt continues to have recurring right LBP without change in severity he thinks may be pyriformis syndrome, without bowel or bladder change, fever, wt loss,  worsening LE pain/numbness/weakness, gait change or falls. Past Medical History:  Diagnosis Date  . ACNE NEC 07/29/2008  . ALLERGIC RHINITIS 07/29/2008  . Chronic kidney disease    kidney stones  . COLONIC POLYPS, HX OF 07/29/2008  . ERECTILE DYSFUNCTION 07/29/2008  . HYPERLIPIDEMIA 07/29/2008  . HYPERTENSION 07/29/2008  . NEPHROLITHIASIS, HX OF 07/29/2008  . PSA, INCREASED 07/29/2008   Past Surgical History:  Procedure Laterality Date  . COLONOSCOPY    . POLYPECTOMY    . TONSILLECTOMY      reports that he has never smoked. He has never used smokeless tobacco. He reports current alcohol use. He reports that he does not use drugs. family history is not on file. Allergies  Allergen Reactions  . Sildenafil  Citrate Other (See Comments)    Make joints ache but can still take it   Current Outpatient Medications on File Prior to Visit  Medication Sig Dispense Refill  . aspirin 81 MG EC tablet Take 81 mg by mouth daily.       No current facility-administered medications on file prior to visit.    Review of Systems Constitutional: Negative for other unusual diaphoresis, sweats, appetite or weight changes HENT: Negative for other worsening hearing loss, ear pain, facial swelling, mouth sores or neck stiffness.   Eyes: Negative for other worsening pain, redness or other visual disturbance.  Respiratory: Negative for other stridor or swelling Cardiovascular: Negative for other palpitations or other chest pain  Gastrointestinal: Negative for worsening diarrhea or loose stools, blood in stool, distention or other pain Genitourinary: Negative for hematuria, flank pain or other change in urine volume.  Musculoskeletal: Negative for myalgias or other joint swelling.  Skin: Negative for other color change, or other wound or worsening drainage.  Neurological: Negative for other syncope or numbness. Hematological: Negative for other adenopathy or swelling Psychiatric/Behavioral: Negative for hallucinations, other worsening agitation, SI, self-injury, or new decreased concentration All otherwise neg per pt     Objective:   Physical Exam BP 122/78   Pulse 64   Temp 98.4 F (36.9 C) (Oral)   Ht 6\' 3"  (1.905 m)   Wt 203 lb (92.1 kg)   SpO2 97%   BMI 25.37 kg/m  VS noted,  Constitutional: Pt is oriented to person, place, and time.  Appears well-developed and well-nourished, in no significant distress and comfortable Head: Normocephalic and atraumatic  Eyes: Conjunctivae and EOM are normal. Pupils are equal, round, and reactive to light Right Ear: External ear normal without discharge Left Ear: External ear normal without discharge Nose: Nose without discharge or deformity Mouth/Throat: Oropharynx  is without other ulcerations and moist  Neck: Normal range of motion. Neck supple. No JVD present. No tracheal deviation present or significant neck LA or mass Cardiovascular: Normal rate, regular rhythm, normal heart sounds and intact distal pulses.   Pulmonary/Chest: WOB normal and breath sounds without rales or wheezing  Abdominal: Soft. Bowel sounds are normal. NT. No HSM  Musculoskeletal: Normal range of motion. Exhibits no edema Lymphadenopathy: Has no other cervical adenopathy.  Neurological: Pt is alert and oriented to person, place, and time. Pt has normal reflexes. No cranial nerve deficit. Motor grossly intact, Gait intact Skin: Skin is warm and dry. No rash noted or new ulcerations Psychiatric:  Has normal mood and affect. Behavior is normal without agitation All otherwise neg per pt  Lab Results  Component Value Date   WBC 5.6 03/30/2019   HGB 14.6 03/30/2019   HCT 42.9 03/30/2019   PLT 186.0 03/30/2019   GLUCOSE 94 03/30/2019   CHOL 149 03/30/2019   TRIG 99.0 03/30/2019   HDL 38.30 (L) 03/30/2019   LDLDIRECT 144.7 10/09/2010   LDLCALC 91 03/30/2019   ALT 15 03/30/2019   AST 18 03/30/2019   NA 141 03/30/2019   K 4.4 03/30/2019   CL 106 03/30/2019   CREATININE 1.37 03/30/2019   BUN 21 03/30/2019   CO2 28 03/30/2019   TSH 1.80 03/30/2019   PSA 2.08 03/30/2019       Assessment & Plan:

## 2019-04-08 NOTE — Assessment & Plan Note (Signed)
Ok for tylenol prn

## 2019-04-08 NOTE — Assessment & Plan Note (Signed)

## 2019-04-08 NOTE — Patient Instructions (Addendum)
You had the shingles shot prescription sent to the pharmacy  Please continue all other medications as before, and refills have been done if requested.  Please have the pharmacy call with any other refills you may need.  Please continue your efforts at being more active, low cholesterol diet, and weight control.  You are otherwise up to date with prevention measures today.  Please keep your appointments with your specialists as you may have planned  Please go to the LAB in the Basement (turn left off the elevator) for the tests to be done today  You will be contacted by phone if any changes need to be made immediately.  Otherwise, you will receive a letter about your results with an explanation, but please check with MyChart first.  Please remember to sign up for MyChart if you have not done so, as this will be important to you in the future with finding out test results, communicating by private email, and scheduling acute appointments online when needed.  Please return in 1 year for your yearly visit, or sooner if needed, with Lab testing done 3-5 days before

## 2019-04-13 DIAGNOSIS — Z23 Encounter for immunization: Secondary | ICD-10-CM | POA: Diagnosis not present

## 2019-07-15 ENCOUNTER — Other Ambulatory Visit: Payer: Self-pay | Admitting: Internal Medicine

## 2019-07-15 NOTE — Telephone Encounter (Signed)
Please refill as per office policy 

## 2019-08-03 ENCOUNTER — Encounter: Payer: Self-pay | Admitting: Internal Medicine

## 2019-08-03 ENCOUNTER — Ambulatory Visit (INDEPENDENT_AMBULATORY_CARE_PROVIDER_SITE_OTHER): Payer: BC Managed Care – PPO | Admitting: Internal Medicine

## 2019-08-03 ENCOUNTER — Other Ambulatory Visit: Payer: Self-pay

## 2019-08-03 VITALS — BP 136/80 | HR 74 | Temp 97.8°F | Ht 75.0 in | Wt 215.0 lb

## 2019-08-03 DIAGNOSIS — R21 Rash and other nonspecific skin eruption: Secondary | ICD-10-CM

## 2019-08-03 DIAGNOSIS — I10 Essential (primary) hypertension: Secondary | ICD-10-CM | POA: Diagnosis not present

## 2019-08-03 DIAGNOSIS — Z23 Encounter for immunization: Secondary | ICD-10-CM | POA: Diagnosis not present

## 2019-08-03 DIAGNOSIS — E785 Hyperlipidemia, unspecified: Secondary | ICD-10-CM | POA: Diagnosis not present

## 2019-08-03 DIAGNOSIS — J309 Allergic rhinitis, unspecified: Secondary | ICD-10-CM | POA: Diagnosis not present

## 2019-08-03 MED ORDER — VALACYCLOVIR HCL 1 G PO TABS: 1000.0000 mg | ORAL_TABLET | Freq: Three times a day (TID) | ORAL | 0 refills | Status: DC

## 2019-08-03 MED ORDER — DOXYCYCLINE HYCLATE 100 MG PO TABS: 100.0000 mg | ORAL_TABLET | Freq: Two times a day (BID) | ORAL | 0 refills | Status: DC

## 2019-08-03 NOTE — Patient Instructions (Addendum)
Please go to [email or web address] to get on the wait list for the vaccine (and any others you wish to put on waitlist as well)  Please take all new medication as prescribed - the valtrex, but also the doxycycline if not improving  You had the Shingrix vaccine #1 today  Please return for NURSE visit appt only in 2 months for Shingles shot #2  Please continue all other medications as before, and refills have been done if requested.  Please have the pharmacy call with any other refills you may need.  Please keep your appointments with your specialists as you may have planned

## 2019-08-03 NOTE — Progress Notes (Addendum)
   Subjective:    Patient ID: Mathew Sanchez., male    DOB: [birth year], 64 y.o.   MRN: [id number]  HPI  Here with 2 days onset itchy rash to post head occipital bilateral at the hairline, similar to previous tx as shingles in past several yrs ago;  No fever, Pt denies chest pain, increased sob or doe, wheezing, orthopnea, PND, increased LE swelling, palpitations, dizziness or syncope.   Pt denies polydipsia, polyuria, Past Medical History:  Diagnosis Date  . ACNE NEC 07/29/2008  . ALLERGIC RHINITIS 07/29/2008  . Chronic kidney disease    kidney stones  . COLONIC POLYPS, HX OF 07/29/2008  . ERECTILE DYSFUNCTION 07/29/2008  . HYPERLIPIDEMIA 07/29/2008  . HYPERTENSION 07/29/2008  . NEPHROLITHIASIS, HX OF 07/29/2008  . PSA, INCREASED 07/29/2008   Past Surgical History:  Procedure Laterality Date  . COLONOSCOPY    . POLYPECTOMY    . TONSILLECTOMY      reports that he has never smoked. He has never used smokeless tobacco. He reports current alcohol use. He reports that he does not use drugs. family history is not on file. Allergies  Allergen Reactions  . Sildenafil Citrate Other (See Comments)    Make joints ache but can still take it   Current Outpatient Medications on File Prior to Visit  Medication Sig Dispense Refill  . amLODipine-benazepril (LOTREL) 5-20 MG capsule Take 1 capsule by mouth daily. 90 capsule 3  . aspirin 81 MG EC tablet Take 81 mg by mouth daily.      Marland Kitchen atorvastatin (LIPITOR) 10 MG tablet Take 1 tablet (10 mg total) by mouth daily. 90 tablet 3   No current facility-administered medications on file prior to visit.   Review of Systems All otherwise neg per pt     Objective:   Physical Exam BP 136/80   Pulse 74   Temp 97.8 F (36.6 C) (Oral)   Ht 6\' 3"  (1.905 m)   Wt 215 lb (97.5 kg)   SpO2 98%   BMI 26.87 kg/m  VS noted,  Constitutional: Pt appears in NAD HENT: Head: NCAT.  Right Ear: External ear normal.  Left Ear: External ear normal.  Eyes: .  Pupils are equal, round, and reactive to light. Conjunctivae and EOM are normal Nose: without d/c or deformity Neck: Neck supple. Gross normal ROM Cardiovascular: Normal rate and regular rhythm.   Pulmonary/Chest: Effort normal and breath sounds without rales or wheezing.  Abd:  Soft, NT, ND, + BS, no organomegaly Neurological: Pt is alert. At baseline orientation, motor grossly intact Skin: Skin is warm. No rashes, other new lesions, no LE edema Psychiatric: Pt behavior is normal without agitation  .All otherwise neg per pt Lab Results  Component Value Date   WBC 5.6 03/30/2019   HGB 14.6 03/30/2019   HCT 42.9 03/30/2019   PLT 186.0 03/30/2019   GLUCOSE 94 03/30/2019   CHOL 149 03/30/2019   TRIG 99.0 03/30/2019   HDL 38.30 (L) 03/30/2019   LDLDIRECT 144.7 10/09/2010   LDLCALC 91 03/30/2019   ALT 15 03/30/2019   AST 18 03/30/2019   NA 141 03/30/2019   K 4.4 03/30/2019   CL 106 03/30/2019   CREATININE 1.37 03/30/2019   BUN 21 03/30/2019   CO2 28 03/30/2019   TSH 1.80 03/30/2019   PSA 2.08 03/30/2019         Assessment & Plan:

## 2019-08-04 NOTE — Assessment & Plan Note (Signed)
stable overall by history and exam, recent data reviewed with pt, and pt to continue medical treatment as before,  to f/u any worsening symptoms or concerns  

## 2019-08-04 NOTE — Assessment & Plan Note (Signed)
Possible shingles - for valtrex asd, consider doxy if not improved for folliculitis, also for shingrix vaccine  In addition to the time spent performing CPE, I spent an additional 25 minutes face to face,in which greater than 50% of this time was spent in counseling and coordination of care for patient's acute illness as documented, including the differential dx, treatment, further evaluation and other management of rash, HTn, HDL, allergies

## 2019-08-04 NOTE — Assessment & Plan Note (Addendum)
stable overall by history and exam, recent data reviewed with pt, and pt to continue medical treatment as before,  to f/u any worsening symptoms or concerns, for lower chol det

## 2019-09-20 ENCOUNTER — Ambulatory Visit: Payer: BC Managed Care – PPO | Attending: Internal Medicine

## 2019-09-20 DIAGNOSIS — Z23 Encounter for immunization: Secondary | ICD-10-CM | POA: Insufficient documentation

## 2019-09-20 NOTE — Progress Notes (Signed)
   [facility]  Name:  Mathew Sanchez.    MRN: [id number] DOB: [birth year]  09/20/2019  Mr. Mathew Sanchez was observed post Covid-19 immunization for 15 minutes without incident. He was provided with Vaccine Information Sheet and instruction to access the V-Safe system.   Mr. Mathew Sanchez was instructed to call 911 with any severe reactions post vaccine: Marland Kitchen Difficulty breathing  . Swelling of face and throat  . A fast heartbeat  . A bad rash all over body  . Dizziness and weakness   Immunizations Administered    Name Date Dose VIS Date Route   Pfizer COVID-19 Vaccine 09/20/2019  4:04 PM 0.3 mL 06/25/2019 Intramuscular   Manufacturer: [employer], [employer]   Lot: [id number]   NDC: [address]-8

## 2019-09-27 ENCOUNTER — Encounter: Payer: Self-pay | Admitting: Internal Medicine

## 2019-09-27 ENCOUNTER — Telehealth: Payer: Self-pay

## 2019-09-27 NOTE — Telephone Encounter (Signed)
New message    The patient call is there any conflict between the Shingles & COVID vaccine that he needs to be aware of.   Shingles appt on 3.22.21  COVID vaccine appt on 3.29.21

## 2019-09-28 NOTE — Telephone Encounter (Signed)
Notified pt w/MD response.../lmb 

## 2019-09-28 NOTE — Telephone Encounter (Signed)
No, no conflict I am aware, thanks

## 2019-10-01 ENCOUNTER — Ambulatory Visit: Payer: BC Managed Care – PPO

## 2019-10-04 ENCOUNTER — Ambulatory Visit (INDEPENDENT_AMBULATORY_CARE_PROVIDER_SITE_OTHER): Payer: BC Managed Care – PPO | Admitting: *Deleted

## 2019-10-04 DIAGNOSIS — Z23 Encounter for immunization: Secondary | ICD-10-CM | POA: Diagnosis not present

## 2019-10-11 ENCOUNTER — Ambulatory Visit: Payer: BC Managed Care – PPO

## 2019-10-25 ENCOUNTER — Ambulatory Visit: Payer: BC Managed Care – PPO | Attending: Internal Medicine

## 2019-10-25 DIAGNOSIS — Z23 Encounter for immunization: Secondary | ICD-10-CM

## 2019-10-25 NOTE — Progress Notes (Signed)
   [facility]  Name:  Mathew Sanchez.    MRN: [id number] DOB: Jan 03, 1956  10/25/2019  Mr. Diguglielmo was observed post Covid-19 immunization for 15 minutes without incident. He was provided with Vaccine Information Sheet and instruction to access the V-Safe system.   Mr. Printup was instructed to call 911 with any severe reactions post vaccine: Marland Kitchen Difficulty breathing  . Swelling of face and throat  . A fast heartbeat  . A bad rash all over body  . Dizziness and weakness   Immunizations Administered    Name Date Dose VIS Date Route   Pfizer COVID-19 Vaccine 10/25/2019 12:41 PM 0.3 mL 06/25/2019 Intramuscular   Manufacturer: [employer], [employer]   Lot: [id number]   NDC: [address]-9

## 2019-12-11 ENCOUNTER — Other Ambulatory Visit: Payer: Self-pay | Admitting: Internal Medicine

## 2019-12-11 DIAGNOSIS — E785 Hyperlipidemia, unspecified: Secondary | ICD-10-CM

## 2019-12-12 NOTE — Telephone Encounter (Signed)
Please refill as per office routine med refill policy (all routine meds refilled for 3 mo or monthly per pt preference up to one year from last visit, then month to month grace period for 3 mo, then further med refills will have to be denied)  

## 2020-02-10 DIAGNOSIS — L570 Actinic keratosis: Secondary | ICD-10-CM | POA: Diagnosis not present

## 2020-03-17 DIAGNOSIS — H43812 Vitreous degeneration, left eye: Secondary | ICD-10-CM | POA: Diagnosis not present

## 2020-04-06 ENCOUNTER — Other Ambulatory Visit (INDEPENDENT_AMBULATORY_CARE_PROVIDER_SITE_OTHER): Payer: BC Managed Care – PPO

## 2020-04-06 DIAGNOSIS — Z125 Encounter for screening for malignant neoplasm of prostate: Secondary | ICD-10-CM | POA: Diagnosis not present

## 2020-04-06 DIAGNOSIS — E538 Deficiency of other specified B group vitamins: Secondary | ICD-10-CM | POA: Diagnosis not present

## 2020-04-06 DIAGNOSIS — Z0000 Encounter for general adult medical examination without abnormal findings: Secondary | ICD-10-CM

## 2020-04-06 DIAGNOSIS — E559 Vitamin D deficiency, unspecified: Secondary | ICD-10-CM

## 2020-04-06 DIAGNOSIS — E611 Iron deficiency: Secondary | ICD-10-CM | POA: Diagnosis not present

## 2020-04-06 LAB — CBC WITH DIFFERENTIAL/PLATELET
Basophils Absolute: 0 10*3/uL (ref 0.0–0.1)
Basophils Relative: 0.6 % (ref 0.0–3.0)
Eosinophils Absolute: 0.1 10*3/uL (ref 0.0–0.7)
Eosinophils Relative: 2 % (ref 0.0–5.0)
HCT: 45.6 % (ref 39.0–52.0)
Hemoglobin: 15.4 g/dL (ref 13.0–17.0)
Lymphocytes Relative: 25.8 % (ref 12.0–46.0)
Lymphs Abs: 1.6 10*3/uL (ref 0.7–4.0)
MCHC: 33.8 g/dL (ref 30.0–36.0)
MCV: 95.8 fl (ref 78.0–100.0)
Monocytes Absolute: 0.6 10*3/uL (ref 0.1–1.0)
Monocytes Relative: 9.6 % (ref 3.0–12.0)
Neutro Abs: 3.7 10*3/uL (ref 1.4–7.7)
Neutrophils Relative %: 62 % (ref 43.0–77.0)
Platelets: 193 10*3/uL (ref 150.0–400.0)
RBC: 4.76 Mil/uL (ref 4.22–5.81)
RDW: 13.1 % (ref 11.5–15.5)
WBC: 6 10*3/uL (ref 4.0–10.5)

## 2020-04-06 LAB — BASIC METABOLIC PANEL
BUN: 20 mg/dL (ref 6–23)
CO2: 27 mEq/L (ref 19–32)
Calcium: 8.7 mg/dL (ref 8.4–10.5)
Chloride: 108 mEq/L (ref 96–112)
Creatinine, Ser: 1.27 mg/dL (ref 0.40–1.50)
GFR: 57.21 mL/min — ABNORMAL LOW (ref >60.00)
Glucose, Bld: 90 mg/dL (ref 70–99)
Potassium: 4.2 mEq/L (ref 3.5–5.1)
Sodium: 142 mEq/L (ref 135–145)

## 2020-04-06 LAB — VITAMIN D 25 HYDROXY (VIT D DEFICIENCY, FRACTURES): VITD: 20.48 ng/mL — ABNORMAL LOW (ref 30.00–100.00)

## 2020-04-06 LAB — LIPID PANEL
Cholesterol: 129 mg/dL (ref 0–200)
HDL: 39.9 mg/dL (ref >39.00)
LDL Cholesterol: 72 mg/dL (ref 0–99)
NonHDL: 88.61
Total CHOL/HDL Ratio: 3
Triglycerides: 82 mg/dL (ref 0.0–149.0)
VLDL: 16.4 mg/dL (ref 0.0–40.0)

## 2020-04-06 LAB — URINALYSIS, ROUTINE W REFLEX MICROSCOPIC
Bilirubin Urine: NEGATIVE
Hgb urine dipstick: NEGATIVE
Ketones, ur: NEGATIVE
Leukocytes,Ua: NEGATIVE
Nitrite: NEGATIVE
RBC / HPF: NONE SEEN (ref 0–?)
Specific Gravity, Urine: 1.025 (ref 1.000–1.030)
Total Protein, Urine: NEGATIVE
Urine Glucose: NEGATIVE
Urobilinogen, UA: 0.2 (ref 0.0–1.0)
pH: 6 (ref 5.0–8.0)

## 2020-04-06 LAB — HEPATIC FUNCTION PANEL
ALT: 14 U/L (ref 0–53)
AST: 16 U/L (ref 0–37)
Albumin: 4.2 g/dL (ref 3.5–5.2)
Alkaline Phosphatase: 66 U/L (ref 39–117)
Bilirubin, Direct: 0.2 mg/dL (ref 0.0–0.3)
Total Bilirubin: 1.1 mg/dL (ref 0.2–1.2)
Total Protein: 6.5 g/dL (ref 6.0–8.3)

## 2020-04-06 LAB — IBC PANEL
Iron: 164 ug/dL (ref 42–165)
Saturation Ratios: 50.5 % — ABNORMAL HIGH (ref 20.0–50.0)
Transferrin: 232 mg/dL (ref 212.0–360.0)

## 2020-04-06 LAB — PSA: PSA: 1.54 ng/mL (ref 0.10–4.00)

## 2020-04-06 LAB — TSH: TSH: 2.06 u[IU]/mL (ref 0.35–4.50)

## 2020-04-06 LAB — VITAMIN B12: Vitamin B-12: 238 pg/mL (ref 211–911)

## 2020-04-11 ENCOUNTER — Encounter: Payer: Self-pay | Admitting: Internal Medicine

## 2020-04-11 ENCOUNTER — Other Ambulatory Visit: Payer: Self-pay

## 2020-04-11 ENCOUNTER — Ambulatory Visit (INDEPENDENT_AMBULATORY_CARE_PROVIDER_SITE_OTHER): Payer: BC Managed Care – PPO | Admitting: Internal Medicine

## 2020-04-11 VITALS — BP 130/80 | HR 60 | Temp 98.4°F | Ht 75.0 in | Wt 215.0 lb

## 2020-04-11 DIAGNOSIS — N183 Chronic kidney disease, stage 3 unspecified: Secondary | ICD-10-CM | POA: Insufficient documentation

## 2020-04-11 DIAGNOSIS — Z0000 Encounter for general adult medical examination without abnormal findings: Secondary | ICD-10-CM

## 2020-04-11 DIAGNOSIS — N1831 Chronic kidney disease, stage 3a: Secondary | ICD-10-CM | POA: Diagnosis not present

## 2020-04-11 DIAGNOSIS — I10 Essential (primary) hypertension: Secondary | ICD-10-CM | POA: Diagnosis not present

## 2020-04-11 DIAGNOSIS — E785 Hyperlipidemia, unspecified: Secondary | ICD-10-CM | POA: Diagnosis not present

## 2020-04-11 MED ORDER — AMLODIPINE BESY-BENAZEPRIL HCL 5-20 MG PO CAPS
1.0000 | ORAL_CAPSULE | Freq: Every day | ORAL | 3 refills | Status: DC
Start: 2020-04-11 — End: 2021-02-12

## 2020-04-11 MED ORDER — ATORVASTATIN CALCIUM 10 MG PO TABS: 10.0000 mg | ORAL_TABLET | Freq: Every day | ORAL | 3 refills | Status: DC

## 2020-04-11 NOTE — Progress Notes (Signed)
Subjective:    Patient ID: Mathew Ramus., male    DOB: 1956-02-05, 63 y.o.   MRN: [id number]  HPI  Here for wellness and f/u;  Overall doing ok;  Pt denies Chest pain, worsening SOB, DOE, wheezing, orthopnea, PND, worsening LE edema, palpitations, dizziness or syncope.  Pt denies neurological change such as new headache, facial or extremity weakness.  Pt denies polydipsia, polyuria, or low sugar symptoms. Pt states overall good compliance with treatment and medications, good tolerability, and has been trying to follow appropriate diet.  Pt denies worsening depressive symptoms, suicidal ideation or panic. No fever, night sweats, wt loss, loss of appetite, or other constitutional symptoms.  Pt states good ability with ADL's, has low fall risk, home safety reviewed and adequate, no other significant changes in hearing or vision, and only occasionally active with exercise.  BP at home < 140/90.   Past Medical History:  Diagnosis Date  . ACNE NEC 07/29/2008  . ALLERGIC RHINITIS 07/29/2008  . Chronic kidney disease    kidney stones  . COLONIC POLYPS, HX OF 07/29/2008  . ERECTILE DYSFUNCTION 07/29/2008  . HYPERLIPIDEMIA 07/29/2008  . HYPERTENSION 07/29/2008  . NEPHROLITHIASIS, HX OF 07/29/2008  . PSA, INCREASED 07/29/2008   Past Surgical History:  Procedure Laterality Date  . COLONOSCOPY    . POLYPECTOMY    . TONSILLECTOMY      reports that he has never smoked. He has never used smokeless tobacco. He reports current alcohol use. He reports that he does not use drugs. family history is not on file. Allergies  Allergen Reactions  . Sildenafil Citrate Other (See Comments)    Make joints ache but can still take it   Current Outpatient Medications on File Prior to Visit  Medication Sig Dispense Refill  . aspirin 81 MG EC tablet Take 81 mg by mouth daily.       No current facility-administered medications on file prior to visit.   Review of Systems All otherwise neg per pt    Objective:    Physical Exam BP 130/80 (BP Location: Left Arm, Patient Position: Sitting, Cuff Size: Large)   Pulse 60   Temp 98.4 F (36.9 C) (Oral)   Ht 6\' 3"  (1.905 m)   Wt 215 lb (97.5 kg)   SpO2 97%   BMI 26.87 kg/m  VS noted,  Constitutional: Pt appears in NAD HENT: Head: NCAT.  Right Ear: External ear normal.  Left Ear: External ear normal.  Eyes: . Pupils are equal, round, and reactive to light. Conjunctivae and EOM are normal Nose: without d/c or deformity Neck: Neck supple. Gross normal ROM Cardiovascular: Normal rate and regular rhythm.   Pulmonary/Chest: Effort normal and breath sounds without rales or wheezing.  Abd:  Soft, NT, ND, + BS, no organomegaly Neurological: Pt is alert. At baseline orientation, motor grossly intact Skin: Skin is warm. No rashes, other new lesions, no LE edema Psychiatric: Pt behavior is normal without agitation  All otherwise neg per pt Lab Results  Component Value Date   WBC 6.0 04/06/2020   HGB 15.4 04/06/2020   HCT 45.6 04/06/2020   PLT 193.0 04/06/2020   GLUCOSE 90 04/06/2020   CHOL 129 04/06/2020   TRIG 82.0 04/06/2020   HDL 39.90 04/06/2020   LDLDIRECT 144.7 10/09/2010   LDLCALC 72 04/06/2020   ALT 14 04/06/2020   AST 16 04/06/2020   NA 142 04/06/2020   K 4.2 04/06/2020   CL 108 04/06/2020   CREATININE 1.27  04/06/2020   BUN 20 04/06/2020   CO2 27 04/06/2020   TSH 2.06 04/06/2020   PSA 1.54 04/06/2020      Assessment & Plan:

## 2020-04-11 NOTE — Patient Instructions (Addendum)
You will be contacted regarding the referral for: colonoscopy  We have discussed the Cardiac CT Score test to measure the calcification level (if any) in your heart arteries.  This test has been ordered in our Computer System, so please call [place] [facility] directly, as they prefer this, at [phone] to be scheduled.  Please continue all other medications as before, and refills have been done if requested.  Please have the pharmacy call with any other refills you may need.  Please continue your efforts at being more active, low cholesterol diet, and weight control.  You are otherwise up to date with prevention measures today.  Please keep your appointments with your specialists as you may have planned  Please make an Appointment to return in 6 months, or sooner if needed, with labs at the [facility] lab a few days prior

## 2020-04-16 ENCOUNTER — Encounter: Payer: Self-pay | Admitting: Internal Medicine

## 2020-04-16 NOTE — Assessment & Plan Note (Signed)
stable overall by history and exam, recent data reviewed with pt, and pt to continue medical treatment as before,  to f/u any worsening symptoms or concerns, for f/u 6 mo

## 2020-04-16 NOTE — Assessment & Plan Note (Signed)
stable overall by history and exam, recent data reviewed with pt, and pt to continue medical treatment as before,  to f/u any worsening symptoms or concerns  

## 2020-04-16 NOTE — Assessment & Plan Note (Signed)

## 2020-04-21 DIAGNOSIS — H43812 Vitreous degeneration, left eye: Secondary | ICD-10-CM | POA: Diagnosis not present

## 2020-05-03 ENCOUNTER — Encounter: Payer: Self-pay | Admitting: Internal Medicine

## 2020-05-03 DIAGNOSIS — Z85828 Personal history of other malignant neoplasm of skin: Secondary | ICD-10-CM | POA: Diagnosis not present

## 2020-05-03 DIAGNOSIS — L578 Other skin changes due to chronic exposure to nonionizing radiation: Secondary | ICD-10-CM | POA: Diagnosis not present

## 2020-05-03 DIAGNOSIS — D485 Neoplasm of uncertain behavior of skin: Secondary | ICD-10-CM | POA: Diagnosis not present

## 2020-05-03 DIAGNOSIS — L570 Actinic keratosis: Secondary | ICD-10-CM | POA: Diagnosis not present

## 2020-05-03 DIAGNOSIS — D225 Melanocytic nevi of trunk: Secondary | ICD-10-CM | POA: Diagnosis not present

## 2020-05-03 DIAGNOSIS — D045 Carcinoma in situ of skin of trunk: Secondary | ICD-10-CM | POA: Diagnosis not present

## 2020-05-03 DIAGNOSIS — L814 Other melanin hyperpigmentation: Secondary | ICD-10-CM | POA: Diagnosis not present

## 2020-05-11 DIAGNOSIS — Z23 Encounter for immunization: Secondary | ICD-10-CM | POA: Diagnosis not present

## 2020-06-12 ENCOUNTER — Encounter: Payer: Self-pay | Admitting: Internal Medicine

## 2020-06-12 MED ORDER — DOXYCYCLINE HYCLATE 100 MG PO TABS: 100.0000 mg | ORAL_TABLET | Freq: Two times a day (BID) | ORAL | 0 refills | Status: DC

## 2020-08-03 DIAGNOSIS — L739 Follicular disorder, unspecified: Secondary | ICD-10-CM | POA: Diagnosis not present

## 2020-08-03 DIAGNOSIS — Z86007 Personal history of in-situ neoplasm of skin: Secondary | ICD-10-CM | POA: Diagnosis not present

## 2020-08-03 DIAGNOSIS — L570 Actinic keratosis: Secondary | ICD-10-CM | POA: Diagnosis not present

## 2020-10-10 ENCOUNTER — Ambulatory Visit: Payer: BC Managed Care – PPO | Admitting: Internal Medicine

## 2020-10-16 ENCOUNTER — Ambulatory Visit: Payer: BC Managed Care – PPO | Admitting: Internal Medicine

## 2020-10-16 ENCOUNTER — Other Ambulatory Visit: Payer: Self-pay

## 2020-10-16 ENCOUNTER — Encounter: Payer: Self-pay | Admitting: Internal Medicine

## 2020-10-16 ENCOUNTER — Other Ambulatory Visit: Payer: Self-pay | Admitting: Internal Medicine

## 2020-10-16 VITALS — BP 116/74 | HR 64 | Temp 97.9°F | Ht 75.0 in | Wt 214.0 lb

## 2020-10-16 DIAGNOSIS — E785 Hyperlipidemia, unspecified: Secondary | ICD-10-CM | POA: Diagnosis not present

## 2020-10-16 DIAGNOSIS — E538 Deficiency of other specified B group vitamins: Secondary | ICD-10-CM | POA: Diagnosis not present

## 2020-10-16 DIAGNOSIS — Z0000 Encounter for general adult medical examination without abnormal findings: Secondary | ICD-10-CM

## 2020-10-16 DIAGNOSIS — N1831 Chronic kidney disease, stage 3a: Secondary | ICD-10-CM | POA: Diagnosis not present

## 2020-10-16 DIAGNOSIS — E611 Iron deficiency: Secondary | ICD-10-CM

## 2020-10-16 DIAGNOSIS — N179 Acute kidney failure, unspecified: Secondary | ICD-10-CM

## 2020-10-16 DIAGNOSIS — Z86010 Personal history of colonic polyps: Secondary | ICD-10-CM

## 2020-10-16 DIAGNOSIS — E7800 Pure hypercholesterolemia, unspecified: Secondary | ICD-10-CM

## 2020-10-16 DIAGNOSIS — E559 Vitamin D deficiency, unspecified: Secondary | ICD-10-CM

## 2020-10-16 DIAGNOSIS — I10 Essential (primary) hypertension: Secondary | ICD-10-CM | POA: Diagnosis not present

## 2020-10-16 LAB — HEPATIC FUNCTION PANEL
ALT: 17 U/L (ref 0–53)
AST: 25 U/L (ref 0–37)
Albumin: 4.1 g/dL (ref 3.5–5.2)
Alkaline Phosphatase: 72 U/L (ref 39–117)
Bilirubin, Direct: 0.2 mg/dL (ref 0.0–0.3)
Total Bilirubin: 1 mg/dL (ref 0.2–1.2)
Total Protein: 6.5 g/dL (ref 6.0–8.3)

## 2020-10-16 LAB — CBC WITH DIFFERENTIAL/PLATELET
Basophils Absolute: 0 10*3/uL (ref 0.0–0.1)
Basophils Relative: 0.8 % (ref 0.0–3.0)
Eosinophils Absolute: 0.2 10*3/uL (ref 0.0–0.7)
Eosinophils Relative: 3 % (ref 0.0–5.0)
HCT: 42.4 % (ref 39.0–52.0)
Hemoglobin: 14.3 g/dL (ref 13.0–17.0)
Lymphocytes Relative: 25.8 % (ref 12.0–46.0)
Lymphs Abs: 1.4 10*3/uL (ref 0.7–4.0)
MCHC: 33.7 g/dL (ref 30.0–36.0)
MCV: 94.6 fl (ref 78.0–100.0)
Monocytes Absolute: 0.7 10*3/uL (ref 0.1–1.0)
Monocytes Relative: 13.3 % — ABNORMAL HIGH (ref 3.0–12.0)
Neutro Abs: 3.2 10*3/uL (ref 1.4–7.7)
Neutrophils Relative %: 57.1 % (ref 43.0–77.0)
Platelets: 176 10*3/uL (ref 150.0–400.0)
RBC: 4.48 Mil/uL (ref 4.22–5.81)
RDW: 13.2 % (ref 11.5–15.5)
WBC: 5.5 10*3/uL (ref 4.0–10.5)

## 2020-10-16 LAB — LIPID PANEL
Cholesterol: 130 mg/dL (ref 0–200)
HDL: 35.8 mg/dL — ABNORMAL LOW (ref >39.00)
LDL Cholesterol: 74 mg/dL (ref 0–99)
NonHDL: 94.49
Total CHOL/HDL Ratio: 4
Triglycerides: 104 mg/dL (ref 0.0–149.0)
VLDL: 20.8 mg/dL (ref 0.0–40.0)

## 2020-10-16 LAB — BASIC METABOLIC PANEL
BUN: 29 mg/dL — ABNORMAL HIGH (ref 6–23)
CO2: 27 mEq/L (ref 19–32)
Calcium: 8.8 mg/dL (ref 8.4–10.5)
Chloride: 107 mEq/L (ref 96–112)
Creatinine, Ser: 1.66 mg/dL — ABNORMAL HIGH (ref 0.40–1.50)
GFR: 43.48 mL/min — ABNORMAL LOW (ref >60.00)
Glucose, Bld: 90 mg/dL (ref 70–99)
Potassium: 4 mEq/L (ref 3.5–5.1)
Sodium: 140 mEq/L (ref 135–145)

## 2020-10-16 LAB — VITAMIN D 25 HYDROXY (VIT D DEFICIENCY, FRACTURES): VITD: 17.84 ng/mL — ABNORMAL LOW (ref 30.00–100.00)

## 2020-10-16 LAB — VITAMIN B12: Vitamin B-12: 719 pg/mL (ref 211–911)

## 2020-10-16 NOTE — Progress Notes (Signed)
Patient ID: Mathew Onnen., male   DOB: 09-Dec-1955, 65 y.o.   MRN: [id number]        Chief Complaint: ckd, low vit d and b12, hx of colon polyps       HPI:  Mathew Stefanski. is a 65 y.o. male here with above, Pt denies chest pain, increased sob or doe, wheezing, orthopnea, PND, increased LE swelling, palpitations, dizziness or syncope.  Denies new neuro focal s/s.   Pt denies polydipsia, polyuria,  Not taking VIt D and b12.  Due for colonoscopy f/u with hx of polyps and f/u renal fxn labs.  No other new complaints   Pt denies fever, wt loss, night sweats, loss of appetite, or other constitutional symptoms  Wt Readings from Last 3 Encounters:  10/16/20 214 lb (97.1 kg)  04/11/20 215 lb (97.5 kg)  08/03/19 215 lb (97.5 kg)   BP Readings from Last 3 Encounters:  10/16/20 116/74  04/11/20 130/80  08/03/19 136/80         Past Medical History:  Diagnosis Date  . ACNE NEC 07/29/2008  . ALLERGIC RHINITIS 07/29/2008  . Chronic kidney disease    kidney stones  . COLONIC POLYPS, HX OF 07/29/2008  . ERECTILE DYSFUNCTION 07/29/2008  . HYPERLIPIDEMIA 07/29/2008  . HYPERTENSION 07/29/2008  . NEPHROLITHIASIS, HX OF 07/29/2008  . PSA, INCREASED 07/29/2008   Past Surgical History:  Procedure Laterality Date  . COLONOSCOPY    . POLYPECTOMY    . TONSILLECTOMY      reports that he has never smoked. He has never used smokeless tobacco. He reports current alcohol use. He reports that he does not use drugs. family history is not on file. Allergies  Allergen Reactions  . Sildenafil Citrate Other (See Comments)    Make joints ache but can still take it   Current Outpatient Medications on File Prior to Visit  Medication Sig Dispense Refill  . amLODipine-benazepril (LOTREL) 5-20 MG capsule Take 1 capsule by mouth daily. 90 capsule 3  . aspirin 81 MG EC tablet Take 81 mg by mouth daily.    Marland Kitchen atorvastatin (LIPITOR) 10 MG tablet Take 1 tablet (10 mg total) by mouth daily. 90 tablet 3   No  current facility-administered medications on file prior to visit.        ROS:  All others reviewed and negative.  Objective        PE:  BP 116/74 (BP Location: Left Arm, Patient Position: Sitting, Cuff Size: Large)   Pulse 64   Temp 97.9 F (36.6 C) (Oral)   Ht 6\' 3"  (1.905 m)   Wt 214 lb (97.1 kg)   SpO2 95%   BMI 26.75 kg/m                 Constitutional: Pt appears in NAD               HENT: Head: NCAT.                Right Ear: External ear normal.                 Left Ear: External ear normal.                Eyes: . Pupils are equal, round, and reactive to light. Conjunctivae and EOM are normal               Nose: without d/c or deformity  Neck: Neck supple. Gross normal ROM               Cardiovascular: Normal rate and regular rhythm.                 Pulmonary/Chest: Effort normal and breath sounds without rales or wheezing.                Abd:  Soft, NT, ND, + BS, no organomegaly               Neurological: Pt is alert. At baseline orientation, motor grossly intact               Skin: Skin is warm. No rashes, no other new lesions, LE edema - none              Psychiatric: Pt behavior is normal without agitation   Micro: none  Cardiac tracings I have personally interpreted today:  none  Pertinent Radiological findings (summarize): none   Lab Results  Component Value Date   WBC 5.5 10/16/2020   HGB 14.3 10/16/2020   HCT 42.4 10/16/2020   PLT 176.0 10/16/2020   GLUCOSE 90 10/16/2020   CHOL 130 10/16/2020   TRIG 104.0 10/16/2020   HDL 35.80 (L) 10/16/2020   LDLDIRECT 144.7 10/09/2010   LDLCALC 74 10/16/2020   ALT 17 10/16/2020   AST 25 10/16/2020   NA 140 10/16/2020   K 4.0 10/16/2020   CL 107 10/16/2020   CREATININE 1.66 (H) 10/16/2020   BUN 29 (H) 10/16/2020   CO2 27 10/16/2020   TSH 2.06 04/06/2020   PSA 1.54 04/06/2020   Assessment/Plan:  Mathew Oleksy. is a 65 y.o. White or Caucasian [1] male with  has a past medical history of  ACNE NEC (07/29/2008), ALLERGIC RHINITIS (07/29/2008), Chronic kidney disease, COLONIC POLYPS, HX OF (07/29/2008), ERECTILE DYSFUNCTION (07/29/2008), HYPERLIPIDEMIA (07/29/2008), HYPERTENSION (07/29/2008), NEPHROLITHIASIS, HX OF (07/29/2008), and PSA, INCREASED (07/29/2008).  Vitamin D deficiency Last vitamin D Lab Results  Component Value Date   VD25OH 17.84 (L) 10/16/2020   Low to start oral replacement   HLD (hyperlipidemia) Lab Results  Component Value Date   LDLCALC 74 10/16/2020   Stable, pt to continue current statin lipitor 10   Essential hypertension BP Readings from Last 3 Encounters:  10/16/20 116/74  04/11/20 130/80  08/03/19 136/80   Stable, pt to continue medical treatment lotrel   CKD (chronic kidney disease) stage 3, GFR 30-59 ml/min (HCC) Also for f/u lab to determine stability,  to f/u any worsening symptoms or concerns  Followup: Return in about 6 months (around 04/17/2021).  Oliver Barre, MD 10/21/2020 10:17 PM [facility]

## 2020-10-16 NOTE — Patient Instructions (Addendum)
Please continue all other medications as before, and refills have been done if requested.  Please have the pharmacy call with any other refills you may need.  Please continue your efforts at being more active, low cholesterol diet, and weight control.  Please keep your appointments with your specialists as you may have planned  You will be contacted regarding the referral for: colonoscopy  Please go to the LAB at the blood drawing area for the tests to be done  You will be contacted by phone if any changes need to be made immediately.  Otherwise, you will receive a letter about your results with an explanation, but please check with MyChart first.  Please remember to sign up for MyChart if you have not done so, as this will be important to you in the future with finding out test results, communicating by private email, and scheduling acute appointments online when needed.  Please make an Appointment to return in 6 months, or sooner if needed, also with Lab Appointment for testing done 3-5 days before at the FIRST FLOOR Lab (so this is for TWO appointments - please see the scheduling desk as you leave)  Due to the ongoing Covid 19 pandemic, our lab now requires an appointment for any labs done at our office.  If you need labs done and do not have an appointment, please call our office ahead of time to schedule before presenting to the lab for your testing.

## 2020-10-21 ENCOUNTER — Encounter: Payer: Self-pay | Admitting: Internal Medicine

## 2020-10-21 DIAGNOSIS — Z860100 Personal history of colon polyps, unspecified: Secondary | ICD-10-CM | POA: Insufficient documentation

## 2020-10-21 NOTE — Addendum Note (Signed)
Addended by: Corwin Levins on: 10/21/2020 10:19 PM   Modules accepted: Orders

## 2020-10-21 NOTE — Assessment & Plan Note (Signed)
Lab Results  Component Value Date   LDLCALC 74 10/16/2020   Stable, pt to continue current statin lipitor 10

## 2020-10-21 NOTE — Assessment & Plan Note (Signed)
For colonoscopy 

## 2020-10-21 NOTE — Assessment & Plan Note (Signed)
Last vitamin D Lab Results  Component Value Date   VD25OH 17.84 (L) 10/16/2020   Low to start oral replacement

## 2020-10-21 NOTE — Assessment & Plan Note (Signed)
Also for f/u lab to determine stability,  to f/u any worsening symptoms or concerns

## 2020-10-21 NOTE — Assessment & Plan Note (Signed)
BP Readings from Last 3 Encounters:  10/16/20 116/74  04/11/20 130/80  08/03/19 136/80   Stable, pt to continue medical treatment lotrel

## 2020-10-23 NOTE — Telephone Encounter (Signed)
PCCs to see pt concern please 

## 2020-10-25 ENCOUNTER — Encounter: Payer: Self-pay | Admitting: Internal Medicine

## 2020-10-25 ENCOUNTER — Ambulatory Visit
Admission: RE | Admit: 2020-10-25 | Discharge: 2020-10-25 | Disposition: A | Discharge status: Home / Self Care | Payer: BC Managed Care – PPO | Source: Ambulatory Visit | Attending: Internal Medicine | Admitting: Internal Medicine

## 2020-10-25 DIAGNOSIS — N1831 Chronic kidney disease, stage 3a: Secondary | ICD-10-CM

## 2020-10-25 DIAGNOSIS — N179 Acute kidney failure, unspecified: Secondary | ICD-10-CM | POA: Diagnosis not present

## 2020-10-25 DIAGNOSIS — N281 Cyst of kidney, acquired: Secondary | ICD-10-CM | POA: Diagnosis not present

## 2020-12-08 ENCOUNTER — Ambulatory Visit: Payer: BC Managed Care – PPO | Admitting: Internal Medicine

## 2020-12-12 DIAGNOSIS — N200 Calculus of kidney: Secondary | ICD-10-CM | POA: Diagnosis not present

## 2020-12-12 DIAGNOSIS — N1831 Chronic kidney disease, stage 3a: Secondary | ICD-10-CM | POA: Diagnosis not present

## 2020-12-12 DIAGNOSIS — N179 Acute kidney failure, unspecified: Secondary | ICD-10-CM | POA: Diagnosis not present

## 2020-12-12 DIAGNOSIS — I129 Hypertensive chronic kidney disease with stage 1 through stage 4 chronic kidney disease, or unspecified chronic kidney disease: Secondary | ICD-10-CM | POA: Diagnosis not present

## 2020-12-15 ENCOUNTER — Ambulatory Visit: Payer: BC Managed Care – PPO | Admitting: Internal Medicine

## 2021-02-09 ENCOUNTER — Other Ambulatory Visit: Payer: Self-pay | Admitting: Internal Medicine

## 2021-02-11 NOTE — Telephone Encounter (Signed)
Please refill as per office routine med refill policy (all routine meds refilled for 3 mo or monthly per pt preference up to one year from last visit, then month to month grace period for 3 mo, then further med refills will have to be denied)  

## 2021-04-03 ENCOUNTER — Telehealth: Payer: Self-pay | Admitting: Internal Medicine

## 2021-04-03 ENCOUNTER — Encounter: Payer: Self-pay | Admitting: Internal Medicine

## 2021-04-03 NOTE — Telephone Encounter (Signed)
Patient would like order for labs to be putting in so he can have them done before his cpe next Friday 09/30

## 2021-04-03 NOTE — Telephone Encounter (Signed)
Orders already in place - -see orders tab

## 2021-04-04 ENCOUNTER — Other Ambulatory Visit: Payer: Self-pay

## 2021-04-04 ENCOUNTER — Other Ambulatory Visit (INDEPENDENT_AMBULATORY_CARE_PROVIDER_SITE_OTHER): Payer: BC Managed Care – PPO

## 2021-04-04 DIAGNOSIS — Z125 Encounter for screening for malignant neoplasm of prostate: Secondary | ICD-10-CM

## 2021-04-04 DIAGNOSIS — E559 Vitamin D deficiency, unspecified: Secondary | ICD-10-CM

## 2021-04-04 DIAGNOSIS — E785 Hyperlipidemia, unspecified: Secondary | ICD-10-CM | POA: Diagnosis not present

## 2021-04-04 DIAGNOSIS — Z0000 Encounter for general adult medical examination without abnormal findings: Secondary | ICD-10-CM

## 2021-04-04 LAB — URINALYSIS, ROUTINE W REFLEX MICROSCOPIC
Bilirubin Urine: NEGATIVE
Ketones, ur: NEGATIVE
Leukocytes,Ua: NEGATIVE
Nitrite: NEGATIVE
RBC / HPF: NONE SEEN (ref 0–?)
Specific Gravity, Urine: 1.025 (ref 1.000–1.030)
Total Protein, Urine: NEGATIVE
Urine Glucose: NEGATIVE
Urobilinogen, UA: 0.2 — AB (ref 0.0–1.0)
pH: 6 (ref 5.0–8.0)

## 2021-04-04 LAB — CBC WITH DIFFERENTIAL/PLATELET
Basophils Absolute: 0 10*3/uL (ref 0.0–0.1)
Basophils Relative: 0.8 % (ref 0.0–3.0)
Eosinophils Absolute: 0.1 10*3/uL (ref 0.0–0.7)
Eosinophils Relative: 1.5 % (ref 0.0–5.0)
HCT: 40.7 % (ref 39.0–52.0)
Hemoglobin: 13.8 g/dL (ref 13.0–17.0)
Lymphocytes Relative: 25.2 % (ref 12.0–46.0)
Lymphs Abs: 1.3 10*3/uL (ref 0.7–4.0)
MCHC: 33.9 g/dL (ref 30.0–36.0)
MCV: 94.3 fl (ref 78.0–100.0)
Monocytes Absolute: 0.5 10*3/uL (ref 0.1–1.0)
Monocytes Relative: 10 % (ref 3.0–12.0)
Neutro Abs: 3.2 10*3/uL (ref 1.4–7.7)
Neutrophils Relative %: 62.5 % (ref 43.0–77.0)
Platelets: 171 10*3/uL (ref 150.0–400.0)
RBC: 4.32 Mil/uL (ref 4.22–5.81)
RDW: 12.8 % (ref 11.5–15.5)
WBC: 5.2 10*3/uL (ref 4.0–10.5)

## 2021-04-04 LAB — PSA: PSA: 2.64 ng/mL (ref 0.10–4.00)

## 2021-04-04 LAB — HEPATIC FUNCTION PANEL
ALT: 13 U/L (ref 0–53)
AST: 17 U/L (ref 0–37)
Albumin: 4 g/dL (ref 3.5–5.2)
Alkaline Phosphatase: 60 U/L (ref 39–117)
Bilirubin, Direct: 0.2 mg/dL (ref 0.0–0.3)
Total Bilirubin: 1 mg/dL (ref 0.2–1.2)
Total Protein: 6.4 g/dL (ref 6.0–8.3)

## 2021-04-04 LAB — BASIC METABOLIC PANEL
BUN: 21 mg/dL (ref 6–23)
CO2: 27 mEq/L (ref 19–32)
Calcium: 8.9 mg/dL (ref 8.4–10.5)
Chloride: 106 mEq/L (ref 96–112)
Creatinine, Ser: 1.28 mg/dL (ref 0.40–1.50)
GFR: 59.21 mL/min — ABNORMAL LOW (ref >60.00)
Glucose, Bld: 93 mg/dL (ref 70–99)
Potassium: 3.9 mEq/L (ref 3.5–5.1)
Sodium: 140 mEq/L (ref 135–145)

## 2021-04-04 LAB — LIPID PANEL
Cholesterol: 146 mg/dL (ref 0–200)
HDL: 39.6 mg/dL (ref >39.00)
LDL Cholesterol: 88 mg/dL (ref 0–99)
NonHDL: 106
Total CHOL/HDL Ratio: 4
Triglycerides: 89 mg/dL (ref 0.0–149.0)
VLDL: 17.8 mg/dL (ref 0.0–40.0)

## 2021-04-04 LAB — TSH: TSH: 1.78 u[IU]/mL (ref 0.35–5.50)

## 2021-04-04 LAB — VITAMIN D 25 HYDROXY (VIT D DEFICIENCY, FRACTURES): VITD: 25.65 ng/mL — ABNORMAL LOW (ref 30.00–100.00)

## 2021-04-13 ENCOUNTER — Encounter: Payer: Self-pay | Admitting: Internal Medicine

## 2021-04-13 ENCOUNTER — Ambulatory Visit (INDEPENDENT_AMBULATORY_CARE_PROVIDER_SITE_OTHER): Payer: BC Managed Care – PPO | Admitting: Internal Medicine

## 2021-04-13 ENCOUNTER — Other Ambulatory Visit: Payer: Self-pay

## 2021-04-13 VITALS — BP 136/78 | HR 60 | Temp 98.8°F | Ht 75.0 in | Wt 203.0 lb

## 2021-04-13 DIAGNOSIS — E538 Deficiency of other specified B group vitamins: Secondary | ICD-10-CM

## 2021-04-13 DIAGNOSIS — E785 Hyperlipidemia, unspecified: Secondary | ICD-10-CM

## 2021-04-13 DIAGNOSIS — N1831 Chronic kidney disease, stage 3a: Secondary | ICD-10-CM

## 2021-04-13 DIAGNOSIS — E559 Vitamin D deficiency, unspecified: Secondary | ICD-10-CM

## 2021-04-13 DIAGNOSIS — Z0000 Encounter for general adult medical examination without abnormal findings: Secondary | ICD-10-CM

## 2021-04-13 DIAGNOSIS — I10 Essential (primary) hypertension: Secondary | ICD-10-CM

## 2021-04-13 DIAGNOSIS — E7800 Pure hypercholesterolemia, unspecified: Secondary | ICD-10-CM

## 2021-04-13 DIAGNOSIS — Z86010 Personal history of colonic polyps: Secondary | ICD-10-CM

## 2021-04-13 MED ORDER — AMLODIPINE BESY-BENAZEPRIL HCL 5-20 MG PO CAPS: 1.0000 | ORAL_CAPSULE | Freq: Every day | ORAL | 3 refills | Status: DC

## 2021-04-13 MED ORDER — ATORVASTATIN CALCIUM 10 MG PO TABS: 10.0000 mg | ORAL_TABLET | Freq: Every day | ORAL | 3 refills | Status: DC

## 2021-04-13 NOTE — Patient Instructions (Signed)
Please take OTC Vitamin D3 at 2000 units per day, indefinitely  Please call if you change your mind about the Cardiac CT score testing  Please continue all other medications as before, and refills have been done if requested.  Please have the pharmacy call with any other refills you may need.  Please continue your efforts at being more active, low cholesterol diet, and weight control.  You are otherwise up to date with prevention measures today.  Please keep your appointments with your specialists as you may have planned  Please make an Appointment to return for your 1 year visit, or sooner if needed, with Lab testing by Appointment as well, to be done about 3-5 days before at the FIRST FLOOR Lab (so this is for TWO appointments - please see the scheduling desk as you leave)   Due to the ongoing Covid 19 pandemic, our lab now requires an appointment for any labs done at our office.  If you need labs done and do not have an appointment, please call our office ahead of time to schedule before presenting to the lab for your testing.

## 2021-04-13 NOTE — Assessment & Plan Note (Signed)
Age and sex appropriate education and counseling updated with regular exercise and diet Referrals for preventative services - for colonoscopy Immunizations addressed - declines covid booster, flu shot tdap Smoking counseling  - none needed Evidence for depression or other mood disorder - none significant Most recent labs reviewed. I have personally reviewed and have noted: 1) the patient's medical and social history 2) The patient's current medications and supplements 3) The patient's height, weight, and BMI have been recorded in the chart

## 2021-04-13 NOTE — Assessment & Plan Note (Signed)
BP Readings from Last 3 Encounters:  04/13/21 136/78  10/16/20 116/74  04/11/20 130/80   Stable, pt to continue medical treatment lotrel

## 2021-04-13 NOTE — Addendum Note (Signed)
Addended by: Corwin Levins on: 04/13/2021 08:58 PM   Modules accepted: Orders

## 2021-04-13 NOTE — Assessment & Plan Note (Signed)
Lab Results  Component Value Date   CREATININE 1.28 04/04/2021   Stable overall, cont to avoid nephrotoxins

## 2021-04-13 NOTE — Progress Notes (Signed)
Patient ID: Mathew Grassia., male   DOB: Feb 25, 1956, 65 y.o.   MRN: [id number]         Chief Complaint:: wellness exam and low vit d, hld, ckd, htn       HPI:  Mathew Wingard. is a 65 y.o. male here for wellness exam; for colonoscopy, declines covid boostere, flu shot, and tdap, o/w up to date with preventive referrals and immunizations                        Also not taking vit d.  Pt denies chest pain, increased sob or doe, wheezing, orthopnea, PND, increased LE swelling, palpitations, dizziness or syncope.   Pt denies polydipsia, polyuria, or new focal neuro s/s.   Pt denies fever, wt loss, night sweats, loss of appetite, or other constitutional symptoms  Tring to follow lowee chol diet.  Pt denies fever, wt loss, night sweats, loss of appetite, or other constitutional symptoms  No other new complaints     Wt Readings from Last 3 Encounters:  04/13/21 203 lb (92.1 kg)  10/16/20 214 lb (97.1 kg)  04/11/20 215 lb (97.5 kg)   BP Readings from Last 3 Encounters:  04/13/21 136/78  10/16/20 116/74  04/11/20 130/80   Immunization History  Administered Date(s) Administered   Influenza Split 05/03/2013   Influenza,inj,Quad PF,6+ Mos 04/15/2019   PFIZER(Purple Top)SARS-COV-2 Vaccination 09/20/2019, 10/25/2019, 05/22/2020   Td 09/18/2009   Zoster Recombinat (Shingrix) 08/03/2019, 10/04/2019   Health Maintenance Due  Topic Date Due   COLONOSCOPY (Pts 45-5yrs Insurance coverage will need to be confirmed)  06/07/2018      Past Medical History:  Diagnosis Date   ACNE NEC 07/29/2008   ALLERGIC RHINITIS 07/29/2008   Chronic kidney disease    kidney stones   COLONIC POLYPS, HX OF 07/29/2008   ERECTILE DYSFUNCTION 07/29/2008   HYPERLIPIDEMIA 07/29/2008   HYPERTENSION 07/29/2008   NEPHROLITHIASIS, HX OF 07/29/2008   PSA, INCREASED 07/29/2008   Past Surgical History:  Procedure Laterality Date   COLONOSCOPY     POLYPECTOMY     TONSILLECTOMY      reports that he has never  smoked. He has never used smokeless tobacco. He reports current alcohol use. He reports that he does not use drugs. family history is not on file. Allergies  Allergen Reactions   Sildenafil Citrate Other (See Comments)    Make joints ache but can still take it   Current Outpatient Medications on File Prior to Visit  Medication Sig Dispense Refill   aspirin 81 MG EC tablet Take 81 mg by mouth daily.     No current facility-administered medications on file prior to visit.        ROS:  All others reviewed and negative.  Objective        PE:  BP 136/78 (BP Location: Right Arm, Patient Position: Sitting, Cuff Size: Large)   Pulse 60   Temp 98.8 F (37.1 C) (Oral)   Ht 6\' 3"  (1.905 m)   Wt 203 lb (92.1 kg)   SpO2 96%   BMI 25.37 kg/m                 Constitutional: Pt appears in NAD               HENT: Head: NCAT.                Right Ear: External ear normal.  Left Ear: External ear normal.                Eyes: . Pupils are equal, round, and reactive to light. Conjunctivae and EOM are normal               Nose: without d/c or deformity               Neck: Neck supple. Gross normal ROM               Cardiovascular: Normal rate and regular rhythm.                 Pulmonary/Chest: Effort normal and breath sounds without rales or wheezing.                Abd:  Soft, NT, ND, + BS, no organomegaly               Neurological: Pt is alert. At baseline orientation, motor grossly intact               Skin: Skin is warm. No rashes, no other new lesions, LE edema - none               Psychiatric: Pt behavior is normal without agitation   Micro: none  Cardiac tracings I have personally interpreted today:  none  Pertinent Radiological findings (summarize): none   Lab Results  Component Value Date   WBC 5.2 04/04/2021   HGB 13.8 04/04/2021   HCT 40.7 04/04/2021   PLT 171.0 04/04/2021   GLUCOSE 93 04/04/2021   CHOL 146 04/04/2021   TRIG 89.0 04/04/2021   HDL 39.60  04/04/2021   LDLDIRECT 144.7 10/09/2010   LDLCALC 88 04/04/2021   ALT 13 04/04/2021   AST 17 04/04/2021   NA 140 04/04/2021   K 3.9 04/04/2021   CL 106 04/04/2021   CREATININE 1.28 04/04/2021   BUN 21 04/04/2021   CO2 27 04/04/2021   TSH 1.78 04/04/2021   PSA 2.64 04/04/2021   Assessment/Plan:  Mathew Mulkern. is a 65 y.o. White or Caucasian [1] male with  has a past medical history of ACNE NEC (07/29/2008), ALLERGIC RHINITIS (07/29/2008), Chronic kidney disease, COLONIC POLYPS, HX OF (07/29/2008), ERECTILE DYSFUNCTION (07/29/2008), HYPERLIPIDEMIA (07/29/2008), HYPERTENSION (07/29/2008), NEPHROLITHIASIS, HX OF (07/29/2008), and PSA, INCREASED (07/29/2008).  Preventative health care Age and sex appropriate education and counseling updated with regular exercise and diet Referrals for preventative services - for colonoscopy Immunizations addressed - declines covid booster, flu shot tdap Smoking counseling  - none needed Evidence for depression or other mood disorder - none significant Most recent labs reviewed. I have personally reviewed and have noted: 1) the patient's medical and social history 2) The patient's current medications and supplements 3) The patient's height, weight, and BMI have been recorded in the chart   HLD (hyperlipidemia) Lab Results  Component Value Date   LDLCALC 88 04/04/2021   Stable, pt to continue current statin lipitor 10   Essential hypertension BP Readings from Last 3 Encounters:  04/13/21 136/78  10/16/20 116/74  04/11/20 130/80   Stable, pt to continue medical treatment lotrel  CKD (chronic kidney disease) stage 3, GFR 30-59 ml/min (HCC) Lab Results  Component Value Date   CREATININE 1.28 04/04/2021   Stable overall, cont to avoid nephrotoxins   Vitamin D deficiency Last vitamin D Lab Results  Component Value Date   VD25OH 25.65 (L) 04/04/2021   Low, to start oral replacement  Followup: Return  in about 1 year (around  04/13/2022).  Oliver Barre, MD 04/13/2021 8:55 PM [facility] [place] [facility] - [facility]

## 2021-04-13 NOTE — Assessment & Plan Note (Signed)
Lab Results  Component Value Date   LDLCALC 88 04/04/2021   Stable, pt to continue current statin lipitor 10

## 2021-04-13 NOTE — Assessment & Plan Note (Signed)
Last vitamin D Lab Results  Component Value Date   VD25OH 25.65 (L) 04/04/2021   Low, to start oral replacement

## 2021-04-23 DIAGNOSIS — Z23 Encounter for immunization: Secondary | ICD-10-CM | POA: Diagnosis not present

## 2021-05-22 ENCOUNTER — Encounter: Payer: Self-pay | Admitting: Internal Medicine

## 2021-05-23 MED ORDER — DOXYCYCLINE HYCLATE 100 MG PO TABS
100.0000 mg | ORAL_TABLET | Freq: Two times a day (BID) | ORAL | 0 refills | Status: DC
Start: 2021-05-23 — End: 2022-01-17

## 2021-09-14 IMAGING — US US RENAL
1 series · 13 of 25 positions shown · non-contrast
Comparison: CT abdomen 07/26/2003

CLINICAL DATA: Acute renal failure superimposed on stage 3 chronic
kidney disease.

EXAM:
RENAL / URINARY TRACT ULTRASOUND COMPLETE

[Series 1: us renal · 0.28mm/px · 13 of 67 slices shown]
[im 1/67]
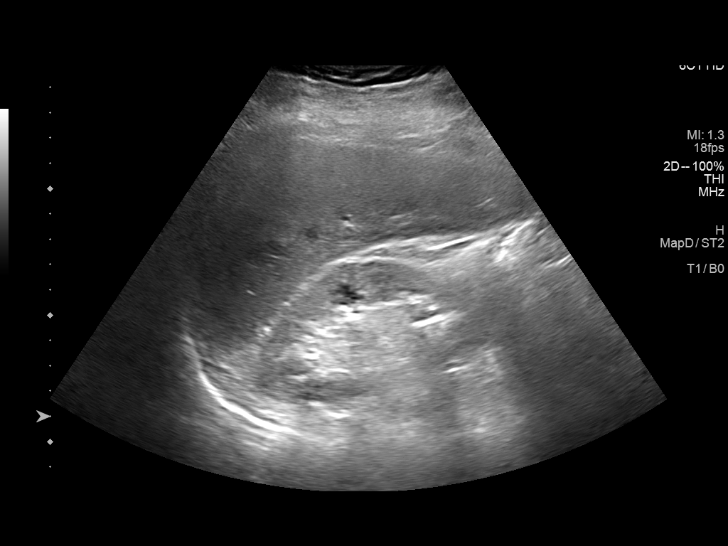
[im 6/67]
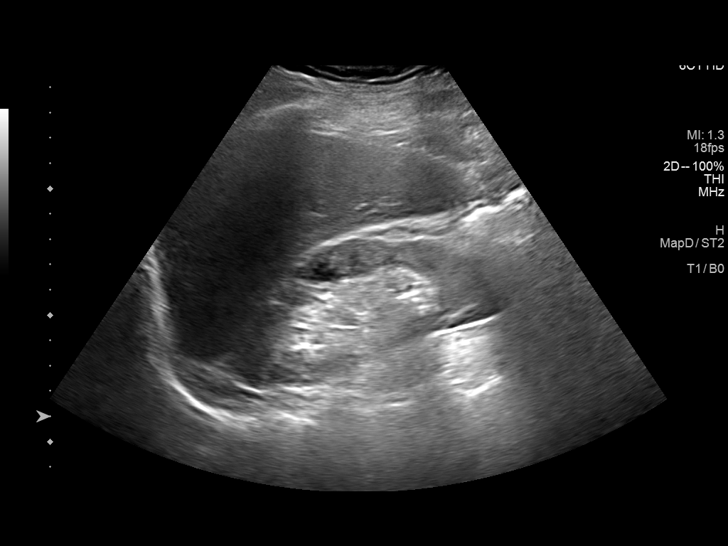
[im 12/67]
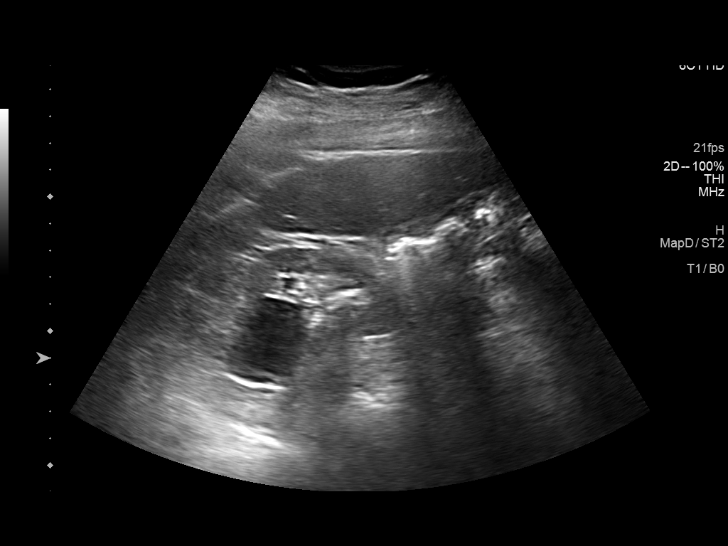
[im 17/67]
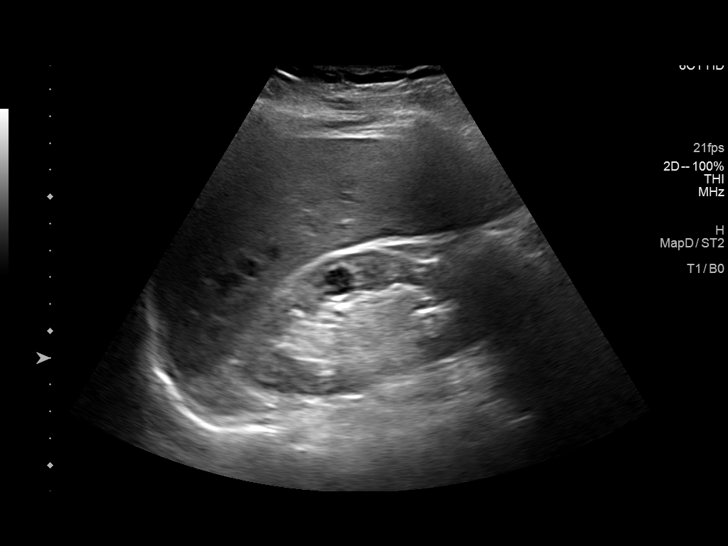
[im 23/67]
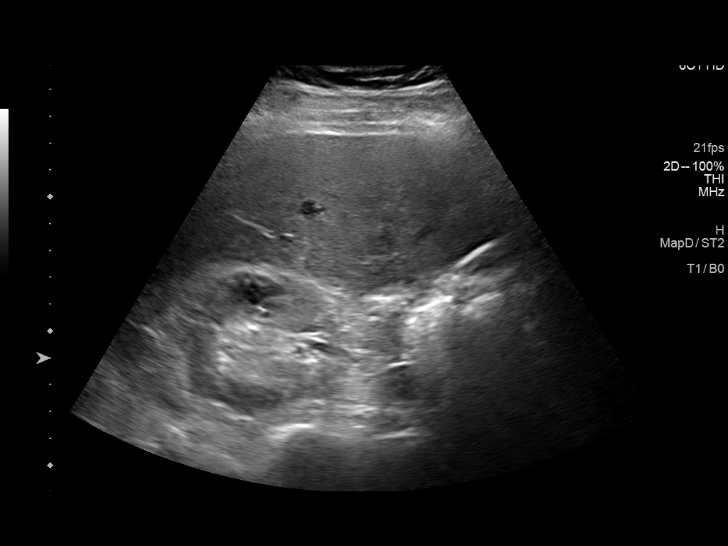
[im 28/67]
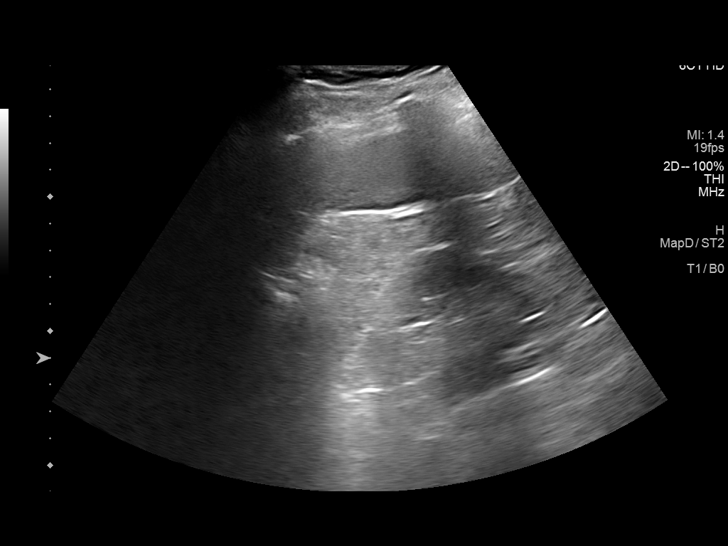
[im 34/67]
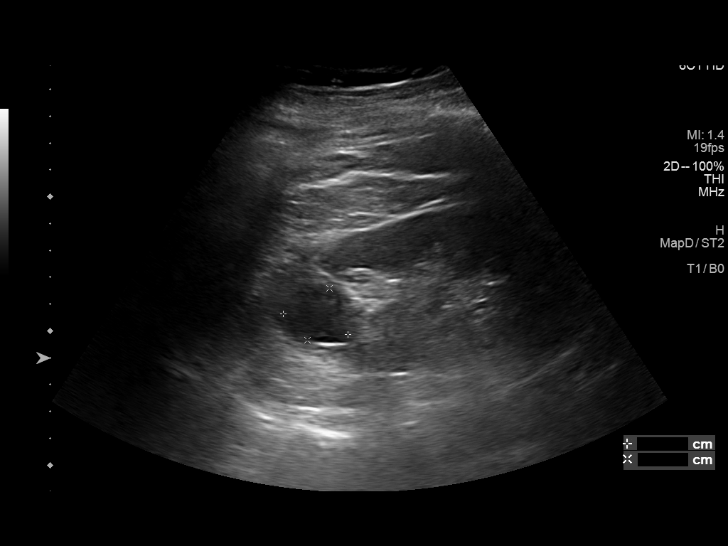
[im 39/67]
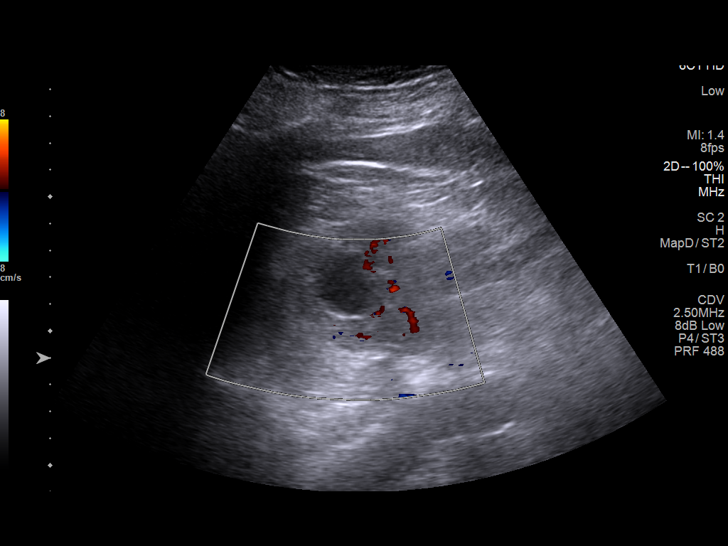
[im 45/67]
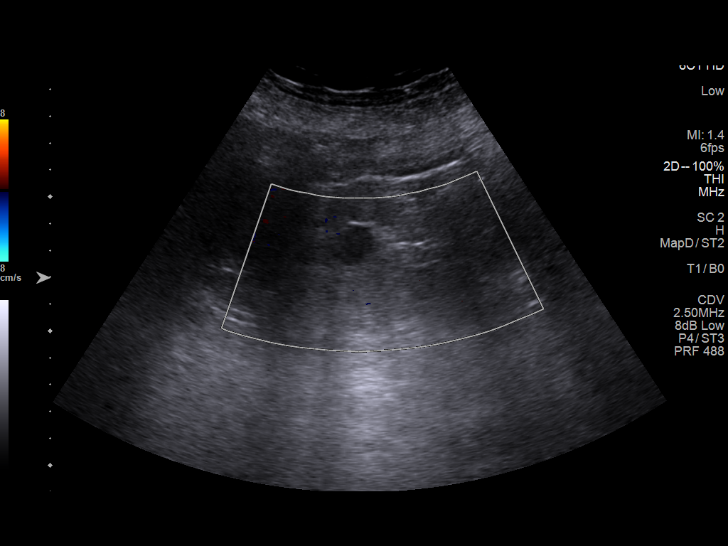
[im 50/67]
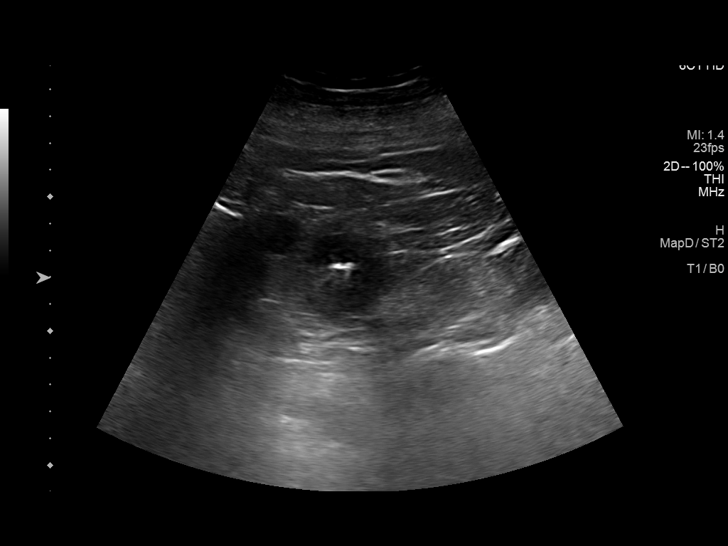
[im 56/67]
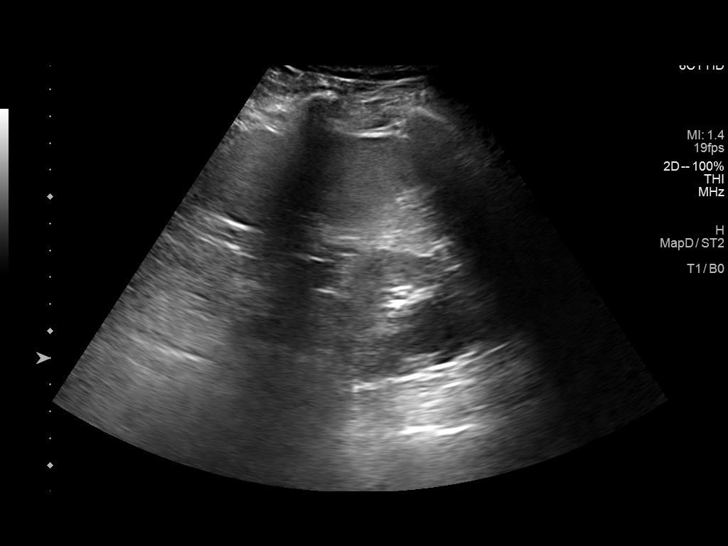
[im 61/67]
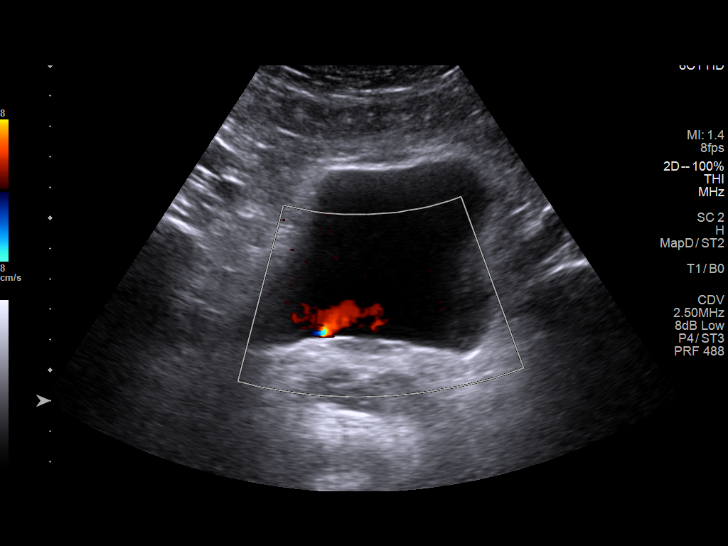
[im 67/67]
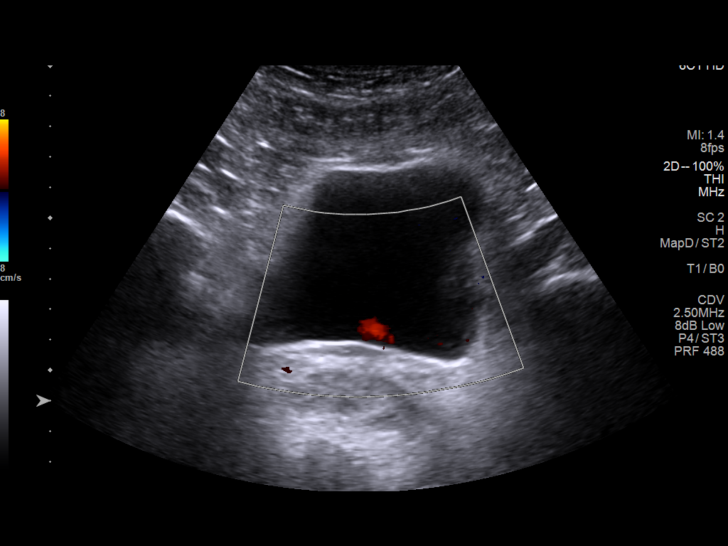

[13 of 25 positions shown; findings below may reference images not displayed]

FINDINGS: Right Kidney:

Renal measurements: 11.1 x 4.7 x 5.5 cm = volume: 150 mL. Increased
echogenicity in the right kidney without hydronephrosis. Hypoechoic
structure in the mid right kidney with increased through
transmission is most compatible with a cyst and measures up to
cm. There is a second small hypoechoic structure in the right kidney
that measures up to 1.1 cm.

Left Kidney:

Renal measurements: 11.6 x 6.2 x 5.9 cm = volume: 220 mL.
Echogenicity is within normal limits. No hydronephrosis. Hypoechoic
structure in the upper pole is most compatible with a cyst and
measures up to 2.5 cm. Hypoechoic structures in the lower pole are
also suggestive for cysts, largest measuring up to 1.9 cm.

Bladder:

Mild thickening or irregularity of the bladder wall.

Other:

Prostate is prominent for size measuring 5.7 x 3.5 x 3.6 cm.
IMPRESSION: 1. Negative for hydronephrosis.
2. Increased echogenicity in the right kidney is suggestive for
chronic medical renal disease.
3. Bilateral renal cysts.
4. Enlarged prostate and suspect mild bladder wall thickening.

## 2022-01-17 ENCOUNTER — Other Ambulatory Visit: Payer: Self-pay | Admitting: Internal Medicine

## 2022-01-17 ENCOUNTER — Encounter: Payer: Self-pay | Admitting: Internal Medicine

## 2022-01-17 MED ORDER — DOXYCYCLINE HYCLATE 100 MG PO TABS: 100.0000 mg | ORAL_TABLET | Freq: Two times a day (BID) | ORAL | 0 refills | Status: DC

## 2022-04-05 DIAGNOSIS — Z713 Dietary counseling and surveillance: Secondary | ICD-10-CM | POA: Diagnosis not present

## 2022-04-05 DIAGNOSIS — Z23 Encounter for immunization: Secondary | ICD-10-CM | POA: Diagnosis not present

## 2022-04-25 ENCOUNTER — Other Ambulatory Visit (INDEPENDENT_AMBULATORY_CARE_PROVIDER_SITE_OTHER): Payer: BC Managed Care – PPO

## 2022-04-25 DIAGNOSIS — E538 Deficiency of other specified B group vitamins: Secondary | ICD-10-CM | POA: Diagnosis not present

## 2022-04-25 DIAGNOSIS — E559 Vitamin D deficiency, unspecified: Secondary | ICD-10-CM

## 2022-04-25 DIAGNOSIS — E785 Hyperlipidemia, unspecified: Secondary | ICD-10-CM

## 2022-04-25 DIAGNOSIS — Z0000 Encounter for general adult medical examination without abnormal findings: Secondary | ICD-10-CM

## 2022-04-25 LAB — BASIC METABOLIC PANEL
BUN: 19 mg/dL (ref 6–23)
CO2: 29 mEq/L (ref 19–32)
Calcium: 9.2 mg/dL (ref 8.4–10.5)
Chloride: 104 mEq/L (ref 96–112)
Creatinine, Ser: 1.44 mg/dL (ref 0.40–1.50)
GFR: 51.02 mL/min — ABNORMAL LOW (ref >60.00)
Glucose, Bld: 97 mg/dL (ref 70–99)
Potassium: 5.2 mEq/L — ABNORMAL HIGH (ref 3.5–5.1)
Sodium: 140 mEq/L (ref 135–145)

## 2022-04-25 LAB — CBC WITH DIFFERENTIAL/PLATELET
Basophils Absolute: 0 10*3/uL (ref 0.0–0.1)
Basophils Relative: 0.6 % (ref 0.0–3.0)
Eosinophils Absolute: 0.1 10*3/uL (ref 0.0–0.7)
Eosinophils Relative: 2.5 % (ref 0.0–5.0)
HCT: 43.7 % (ref 39.0–52.0)
Hemoglobin: 14.7 g/dL (ref 13.0–17.0)
Lymphocytes Relative: 24 % (ref 12.0–46.0)
Lymphs Abs: 1.4 10*3/uL (ref 0.7–4.0)
MCHC: 33.6 g/dL (ref 30.0–36.0)
MCV: 94.7 fl (ref 78.0–100.0)
Monocytes Absolute: 0.6 10*3/uL (ref 0.1–1.0)
Monocytes Relative: 10.7 % (ref 3.0–12.0)
Neutro Abs: 3.6 10*3/uL (ref 1.4–7.7)
Neutrophils Relative %: 62.2 % (ref 43.0–77.0)
Platelets: 192 10*3/uL (ref 150.0–400.0)
RBC: 4.61 Mil/uL (ref 4.22–5.81)
RDW: 13 % (ref 11.5–15.5)
WBC: 5.8 10*3/uL (ref 4.0–10.5)

## 2022-04-25 LAB — URINALYSIS, ROUTINE W REFLEX MICROSCOPIC
Bilirubin Urine: NEGATIVE
Hgb urine dipstick: NEGATIVE
Ketones, ur: NEGATIVE
Leukocytes,Ua: NEGATIVE
Nitrite: NEGATIVE
Specific Gravity, Urine: 1.015 (ref 1.000–1.030)
Total Protein, Urine: 30 — AB
Urine Glucose: NEGATIVE
Urobilinogen, UA: 1 (ref 0.0–1.0)
pH: 7 (ref 5.0–8.0)

## 2022-04-25 LAB — LIPID PANEL
Cholesterol: 146 mg/dL (ref 0–200)
HDL: 40.3 mg/dL (ref >39.00)
LDL Cholesterol: 87 mg/dL (ref 0–99)
NonHDL: 106.15
Total CHOL/HDL Ratio: 4
Triglycerides: 94 mg/dL (ref 0.0–149.0)
VLDL: 18.8 mg/dL (ref 0.0–40.0)

## 2022-04-25 LAB — HEPATIC FUNCTION PANEL
ALT: 13 U/L (ref 0–53)
AST: 18 U/L (ref 0–37)
Albumin: 4 g/dL (ref 3.5–5.2)
Alkaline Phosphatase: 74 U/L (ref 39–117)
Bilirubin, Direct: 0.2 mg/dL (ref 0.0–0.3)
Total Bilirubin: 1.1 mg/dL (ref 0.2–1.2)
Total Protein: 6.6 g/dL (ref 6.0–8.3)

## 2022-04-25 LAB — VITAMIN D 25 HYDROXY (VIT D DEFICIENCY, FRACTURES): VITD: 29.4 ng/mL — ABNORMAL LOW (ref 30.00–100.00)

## 2022-04-25 LAB — PSA: PSA: 1.88 ng/mL (ref 0.10–4.00)

## 2022-04-25 LAB — TSH: TSH: 1.44 u[IU]/mL (ref 0.35–5.50)

## 2022-04-25 LAB — VITAMIN B12: Vitamin B-12: 416 pg/mL (ref 211–911)

## 2022-05-02 ENCOUNTER — Ambulatory Visit (INDEPENDENT_AMBULATORY_CARE_PROVIDER_SITE_OTHER): Payer: BC Managed Care – PPO | Admitting: Internal Medicine

## 2022-05-02 VITALS — BP 122/76 | HR 68 | Temp 98.1°F | Ht 75.0 in | Wt 205.0 lb

## 2022-05-02 DIAGNOSIS — Z86010 Personal history of colonic polyps: Secondary | ICD-10-CM

## 2022-05-02 DIAGNOSIS — Z0001 Encounter for general adult medical examination with abnormal findings: Secondary | ICD-10-CM | POA: Diagnosis not present

## 2022-05-02 DIAGNOSIS — E7800 Pure hypercholesterolemia, unspecified: Secondary | ICD-10-CM

## 2022-05-02 DIAGNOSIS — I10 Essential (primary) hypertension: Secondary | ICD-10-CM

## 2022-05-02 DIAGNOSIS — E559 Vitamin D deficiency, unspecified: Secondary | ICD-10-CM

## 2022-05-02 DIAGNOSIS — E538 Deficiency of other specified B group vitamins: Secondary | ICD-10-CM

## 2022-05-02 DIAGNOSIS — Z125 Encounter for screening for malignant neoplasm of prostate: Secondary | ICD-10-CM

## 2022-05-02 DIAGNOSIS — N1831 Chronic kidney disease, stage 3a: Secondary | ICD-10-CM

## 2022-05-02 DIAGNOSIS — R739 Hyperglycemia, unspecified: Secondary | ICD-10-CM

## 2022-05-02 NOTE — Assessment & Plan Note (Addendum)
Last vitamin D Lab Results  Component Value Date   VD25OH 29.40 (L) 04/25/2022   Low, reminded to increase oral replacement to 2000 units qd

## 2022-05-02 NOTE — Progress Notes (Signed)
Patient ID: Mathew Hicklin., male   DOB: 06-23-1956, 66 y.o.   MRN: [id number]         Chief Complaint:: wellness exam and low vit d, hld, hx colon polyps, htn, ckd 3a       HPI:  Mathew Terhune. is a 66 y.o. male here for wellness exam; declines covid booster, pneumovax, tdap, but for colonoscopy as is due.  O/w up to date                        Also taking low dose vit d 1000 u.  Pt denies chest pain, increased sob or doe, wheezing, orthopnea, PND, increased LE swelling, palpitations, dizziness or syncope.   Pt denies polydipsia, polyuria, or new focal neuro s/s.    Pt denies fever, wt loss, night sweats, loss of appetite, or other constitutional symptoms     Wt Readings from Last 3 Encounters:  05/02/22 205 lb (93 kg)  04/13/21 203 lb (92.1 kg)  10/16/20 214 lb (97.1 kg)   BP Readings from Last 3 Encounters:  05/02/22 122/76  04/13/21 136/78  10/16/20 116/74   Immunization History  Administered Date(s) Administered   Influenza Split 05/03/2013   Influenza,inj,Quad PF,6+ Mos 04/15/2019   Influenza-Unspecified 04/02/2022   PFIZER(Purple Top)SARS-COV-2 Vaccination 09/20/2019, 10/25/2019, 05/22/2020   Td 09/18/2009   Zoster Recombinat (Shingrix) 08/03/2019, 10/04/2019  There are no preventive care reminders to display for this patient.    Past Medical History:  Diagnosis Date   ACNE NEC 07/29/2008   ALLERGIC RHINITIS 07/29/2008   Chronic kidney disease    kidney stones   COLONIC POLYPS, HX OF 07/29/2008   ERECTILE DYSFUNCTION 07/29/2008   HYPERLIPIDEMIA 07/29/2008   HYPERTENSION 07/29/2008   NEPHROLITHIASIS, HX OF 07/29/2008   PSA, INCREASED 07/29/2008   Past Surgical History:  Procedure Laterality Date   COLONOSCOPY     POLYPECTOMY     TONSILLECTOMY      reports that he has never smoked. He has never used smokeless tobacco. He reports current alcohol use. He reports that he does not use drugs. family history is not on file. No Active Allergies Current  Outpatient Medications on File Prior to Visit  Medication Sig Dispense Refill   amLODipine-benazepril (LOTREL) 5-20 MG capsule Take 1 capsule by mouth daily. 90 capsule 3   aspirin 81 MG EC tablet Take 81 mg by mouth daily.     atorvastatin (LIPITOR) 10 MG tablet Take 1 tablet (10 mg total) by mouth daily. 90 tablet 3   doxycycline (VIBRA-TABS) 100 MG tablet Take 1 tablet (100 mg total) by mouth 2 (two) times daily. 20 tablet 0   No current facility-administered medications on file prior to visit.        ROS:  All others reviewed and negative.  Objective        PE:  BP 122/76 (BP Location: Right Arm, Patient Position: Sitting, Cuff Size: Large)   Pulse 68   Temp 98.1 F (36.7 C) (Oral)   Ht 6\' 3"  (1.905 m)   Wt 205 lb (93 kg)   SpO2 95%   BMI 25.62 kg/m                 Constitutional: Pt appears in NAD               HENT: Head: NCAT.                Right Ear: External  ear normal.                 Left Ear: External ear normal.                Eyes: . Pupils are equal, round, and reactive to light. Conjunctivae and EOM are normal               Nose: without d/c or deformity               Neck: Neck supple. Gross normal ROM               Cardiovascular: Normal rate and regular rhythm.                 Pulmonary/Chest: Effort normal and breath sounds without rales or wheezing.                Abd:  Soft, NT, ND, + BS, no organomegaly               Neurological: Pt is alert. At baseline orientation, motor grossly intact               Skin: Skin is warm. No rashes, no other new lesions, LE edema - none               Psychiatric: Pt behavior is normal without agitation   Micro: none  Cardiac tracings I have personally interpreted today:  none  Pertinent Radiological findings (summarize): none   Lab Results  Component Value Date   WBC 5.8 04/25/2022   HGB 14.7 04/25/2022   HCT 43.7 04/25/2022   PLT 192.0 04/25/2022   GLUCOSE 97 04/25/2022   CHOL 146 04/25/2022   TRIG 94.0  04/25/2022   HDL 40.30 04/25/2022   LDLDIRECT 144.7 10/09/2010   LDLCALC 87 04/25/2022   ALT 13 04/25/2022   AST 18 04/25/2022   NA 140 04/25/2022   K 5.2 No hemolysis seen (H) 04/25/2022   CL 104 04/25/2022   CREATININE 1.44 04/25/2022   BUN 19 04/25/2022   CO2 29 04/25/2022   TSH 1.44 04/25/2022   PSA 1.88 04/25/2022   Assessment/Plan:  Mathew Ramus. is a 66 y.o. White or Caucasian [1] male with  has a past medical history of ACNE NEC (07/29/2008), ALLERGIC RHINITIS (07/29/2008), Chronic kidney disease, COLONIC POLYPS, HX OF (07/29/2008), ERECTILE DYSFUNCTION (07/29/2008), HYPERLIPIDEMIA (07/29/2008), HYPERTENSION (07/29/2008), NEPHROLITHIASIS, HX OF (07/29/2008), and PSA, INCREASED (07/29/2008).  Vitamin D deficiency Last vitamin D Lab Results  Component Value Date   VD25OH 29.40 (L) 04/25/2022   Low, reminded to increase oral replacement to 2000 units qd  Encounter for well adult exam with abnormal findings Age and sex appropriate education and counseling updated with regular exercise and diet Referrals for preventative services - for colonoscopy Immunizations addressed - declines tdap and covid booster, for shingrx at pharmacy Smoking counseling  - none needed Evidence for depression or other mood disorder - none significant Most recent labs reviewed. I have personally reviewed and have noted: 1) the patient's medical and social history 2) The patient's current medications and supplements 3) The patient's height, weight, and BMI have been recorded in the chart   HLD (hyperlipidemia) Lab Results  Component Value Date   LDLCALC 87 04/25/2022   Stable, pt to continue current statin lipitor 10 mg, declines cardiac CT score for now   History of colon polyps For colonoscopy as is due  Essential hypertension BP Readings from Last 3 Encounters:  05/02/22 122/76  04/13/21 136/78  10/16/20 116/74   Stable, pt to continue medical treatment lotrel 5 20 mg qd   CKD  (chronic kidney disease) stage 3, GFR 30-59 ml/min (HCC) Lab Results  Component Value Date   CREATININE 1.44 04/25/2022   Stable overall, cont to avoid nephrotoxins  Followup: No follow-ups on file.  Oliver Barre, MD 05/05/2022 3:40 PM [facility]

## 2022-05-02 NOTE — Patient Instructions (Signed)
Ok to double up on your Vitamin D to at least 2000 units per day  Please call if you would want to have the Cardiac CT score testing done  You will be contacted regarding the referral for: colonoscopy  Please continue all other medications as before, and refills have been done if requested.  Please have the pharmacy call with any other refills you may need.  Please continue your efforts at being more active, low cholesterol diet, and weight control.  You are otherwise up to date with prevention measures today.  Please keep your appointments with your specialists as you may have planned  Please make an Appointment to return for your 1 year visit, or sooner if needed, with Lab testing by Appointment as well, to be done about 3-5 days before at the [facility] (so this is for TWO appointments - please see the scheduling desk as you leave)

## 2022-05-03 ENCOUNTER — Other Ambulatory Visit: Payer: Self-pay | Admitting: Internal Medicine

## 2022-05-03 DIAGNOSIS — E785 Hyperlipidemia, unspecified: Secondary | ICD-10-CM

## 2022-05-03 NOTE — Telephone Encounter (Signed)
Please refill as per office routine med refill policy (all routine meds to be refilled for 3 mo or monthly (per pt preference) up to one year from last visit, then month to month grace period for 3 mo, then further med refills will have to be denied) ? ?

## 2022-05-05 NOTE — Assessment & Plan Note (Signed)
For colonoscopy as is due 

## 2022-05-05 NOTE — Assessment & Plan Note (Signed)
BP Readings from Last 3 Encounters:  05/02/22 122/76  04/13/21 136/78  10/16/20 116/74   Stable, pt to continue medical treatment lotrel 5 20 mg qd

## 2022-05-05 NOTE — Assessment & Plan Note (Addendum)
Lab Results  Component Value Date   LDLCALC 87 04/25/2022   Stable, pt to continue current statin lipitor 10 mg, declines cardiac CT score for now

## 2022-05-05 NOTE — Assessment & Plan Note (Signed)
Lab Results  Component Value Date   CREATININE 1.44 04/25/2022   Stable overall, cont to avoid nephrotoxins

## 2022-05-05 NOTE — Assessment & Plan Note (Signed)
Age and sex appropriate education and counseling updated with regular exercise and diet Referrals for preventative services - for colonoscopy Immunizations addressed - declines tdap and covid booster, for shingrx at pharmacy Smoking counseling  - none needed Evidence for depression or other mood disorder - none significant Most recent labs reviewed. I have personally reviewed and have noted: 1) the patient's medical and social history 2) The patient's current medications and supplements 3) The patient's height, weight, and BMI have been recorded in the chart

## 2022-05-13 ENCOUNTER — Ambulatory Visit: Payer: BC Managed Care – PPO | Admitting: Internal Medicine

## 2022-05-21 ENCOUNTER — Encounter: Payer: Self-pay | Admitting: Internal Medicine

## 2022-05-21 DIAGNOSIS — E785 Hyperlipidemia, unspecified: Secondary | ICD-10-CM

## 2022-05-21 MED ORDER — ATORVASTATIN CALCIUM 10 MG PO TABS: 10.0000 mg | ORAL_TABLET | Freq: Every day | ORAL | 3 refills | Status: DC

## 2022-05-21 MED ORDER — AMLODIPINE BESY-BENAZEPRIL HCL 5-20 MG PO CAPS: 1.0000 | ORAL_CAPSULE | Freq: Every day | ORAL | 3 refills | Status: DC

## 2022-10-29 DIAGNOSIS — Z713 Dietary counseling and surveillance: Secondary | ICD-10-CM | POA: Diagnosis not present

## 2022-12-04 ENCOUNTER — Other Ambulatory Visit (HOSPITAL_COMMUNITY): Payer: Self-pay

## 2022-12-04 ENCOUNTER — Emergency Department (HOSPITAL_COMMUNITY): Payer: BC Managed Care – PPO

## 2022-12-04 ENCOUNTER — Inpatient Hospital Stay (HOSPITAL_COMMUNITY)
Admission: EM | Admit: 2022-12-04 | Discharge: 2022-12-06 | DRG: 308 | Disposition: A | Discharge status: Home / Self Care | Payer: BC Managed Care – PPO | Attending: Internal Medicine | Admitting: Internal Medicine

## 2022-12-04 ENCOUNTER — Emergency Department (HOSPITAL_COMMUNITY)
Admit: 2022-12-04 | Discharge: 2022-12-04 | Disposition: A | Discharge status: Home / Self Care | Payer: BC Managed Care – PPO | Attending: Emergency Medicine | Admitting: Emergency Medicine

## 2022-12-04 ENCOUNTER — Encounter (HOSPITAL_COMMUNITY): Payer: Self-pay

## 2022-12-04 ENCOUNTER — Other Ambulatory Visit: Payer: Self-pay

## 2022-12-04 ENCOUNTER — Telehealth (HOSPITAL_COMMUNITY): Payer: Self-pay | Admitting: Pharmacy Technician

## 2022-12-04 DIAGNOSIS — I4891 Unspecified atrial fibrillation: Principal | ICD-10-CM | POA: Diagnosis present

## 2022-12-04 DIAGNOSIS — M79605 Pain in left leg: Secondary | ICD-10-CM

## 2022-12-04 DIAGNOSIS — E785 Hyperlipidemia, unspecified: Secondary | ICD-10-CM | POA: Diagnosis present

## 2022-12-04 DIAGNOSIS — N1831 Chronic kidney disease, stage 3a: Secondary | ICD-10-CM | POA: Diagnosis not present

## 2022-12-04 DIAGNOSIS — I82462 Acute embolism and thrombosis of left calf muscular vein: Secondary | ICD-10-CM | POA: Diagnosis present

## 2022-12-04 DIAGNOSIS — I8290 Acute embolism and thrombosis of unspecified vein: Secondary | ICD-10-CM | POA: Diagnosis present

## 2022-12-04 DIAGNOSIS — Z79899 Other long term (current) drug therapy: Secondary | ICD-10-CM | POA: Diagnosis not present

## 2022-12-04 DIAGNOSIS — I2699 Other pulmonary embolism without acute cor pulmonale: Principal | ICD-10-CM | POA: Diagnosis present

## 2022-12-04 DIAGNOSIS — I82492 Acute embolism and thrombosis of other specified deep vein of left lower extremity: Secondary | ICD-10-CM | POA: Diagnosis not present

## 2022-12-04 DIAGNOSIS — I10 Essential (primary) hypertension: Secondary | ICD-10-CM | POA: Diagnosis present

## 2022-12-04 DIAGNOSIS — I480 Paroxysmal atrial fibrillation: Secondary | ICD-10-CM | POA: Diagnosis present

## 2022-12-04 DIAGNOSIS — I129 Hypertensive chronic kidney disease with stage 1 through stage 4 chronic kidney disease, or unspecified chronic kidney disease: Secondary | ICD-10-CM | POA: Diagnosis not present

## 2022-12-04 DIAGNOSIS — Z86718 Personal history of other venous thrombosis and embolism: Secondary | ICD-10-CM | POA: Diagnosis not present

## 2022-12-04 DIAGNOSIS — Z7982 Long term (current) use of aspirin: Secondary | ICD-10-CM | POA: Diagnosis not present

## 2022-12-04 DIAGNOSIS — R079 Chest pain, unspecified: Secondary | ICD-10-CM | POA: Diagnosis not present

## 2022-12-04 DIAGNOSIS — J984 Other disorders of lung: Secondary | ICD-10-CM | POA: Diagnosis not present

## 2022-12-04 DIAGNOSIS — R071 Chest pain on breathing: Secondary | ICD-10-CM | POA: Diagnosis not present

## 2022-12-04 DIAGNOSIS — N183 Chronic kidney disease, stage 3 unspecified: Secondary | ICD-10-CM | POA: Diagnosis present

## 2022-12-04 DIAGNOSIS — I82812 Embolism and thrombosis of superficial veins of left lower extremities: Secondary | ICD-10-CM | POA: Diagnosis present

## 2022-12-04 DIAGNOSIS — R0602 Shortness of breath: Secondary | ICD-10-CM | POA: Diagnosis not present

## 2022-12-04 DIAGNOSIS — I82402 Acute embolism and thrombosis of unspecified deep veins of left lower extremity: Secondary | ICD-10-CM

## 2022-12-04 LAB — HEPATIC FUNCTION PANEL
ALT: 14 U/L (ref 0–44)
AST: 15 U/L (ref 15–41)
Albumin: 3.6 g/dL (ref 3.5–5.0)
Alkaline Phosphatase: 66 U/L (ref 38–126)
Bilirubin, Direct: 0.2 mg/dL (ref 0.0–0.2)
Indirect Bilirubin: 1.4 mg/dL — ABNORMAL HIGH (ref 0.3–0.9)
Total Bilirubin: 1.6 mg/dL — ABNORMAL HIGH (ref 0.3–1.2)
Total Protein: 7.2 g/dL (ref 6.5–8.1)

## 2022-12-04 LAB — BASIC METABOLIC PANEL
Anion gap: 9 (ref 5–15)
BUN: 19 mg/dL (ref 8–23)
CO2: 23 mmol/L (ref 22–32)
Calcium: 8.6 mg/dL — ABNORMAL LOW (ref 8.9–10.3)
Chloride: 105 mmol/L (ref 98–111)
Creatinine, Ser: 1.4 mg/dL — ABNORMAL HIGH (ref 0.61–1.24)
GFR, Estimated: 55 mL/min — ABNORMAL LOW (ref >60)
Glucose, Bld: 130 mg/dL — ABNORMAL HIGH (ref 70–99)
Potassium: 4 mmol/L (ref 3.5–5.1)
Sodium: 137 mmol/L (ref 135–145)

## 2022-12-04 LAB — CBC
HCT: 42.5 % (ref 39.0–52.0)
Hemoglobin: 14.1 g/dL (ref 13.0–17.0)
MCH: 31.5 pg (ref 26.0–34.0)
MCHC: 33.2 g/dL (ref 30.0–36.0)
MCV: 95.1 fL (ref 80.0–100.0)
Platelets: 228 10*3/uL (ref 150–400)
RBC: 4.47 MIL/uL (ref 4.22–5.81)
RDW: 12.1 % (ref 11.5–15.5)
WBC: 9.3 10*3/uL (ref 4.0–10.5)
nRBC: 0 % (ref 0.0–0.2)

## 2022-12-04 LAB — TSH: TSH: 1.381 u[IU]/mL (ref 0.350–4.500)

## 2022-12-04 LAB — I-STAT CHEM 8, ED
BUN: 18 mg/dL (ref 8–23)
Calcium, Ion: 1.18 mmol/L (ref 1.15–1.40)
Chloride: 105 mmol/L (ref 98–111)
Creatinine, Ser: 1.4 mg/dL — ABNORMAL HIGH (ref 0.61–1.24)
Glucose, Bld: 128 mg/dL — ABNORMAL HIGH (ref 70–99)
HCT: 43 % (ref 39.0–52.0)
Hemoglobin: 14.6 g/dL (ref 13.0–17.0)
Potassium: 4.1 mmol/L (ref 3.5–5.1)
Sodium: 140 mmol/L (ref 135–145)
TCO2: 26 mmol/L (ref 22–32)

## 2022-12-04 LAB — BRAIN NATRIURETIC PEPTIDE: B Natriuretic Peptide: 38.3 pg/mL (ref 0.0–100.0)

## 2022-12-04 LAB — HIV ANTIBODY (ROUTINE TESTING W REFLEX): HIV Screen 4th Generation wRfx: NONREACTIVE

## 2022-12-04 LAB — TROPONIN I (HIGH SENSITIVITY)
Troponin I (High Sensitivity): 6 ng/L (ref <18)
Troponin I (High Sensitivity): 4 ng/L (ref <18)

## 2022-12-04 LAB — PROTIME-INR
INR: 1.1 (ref 0.8–1.2)
Prothrombin Time: 14.9 seconds (ref 11.4–15.2)

## 2022-12-04 LAB — MAGNESIUM: Magnesium: 2.1 mg/dL (ref 1.7–2.4)

## 2022-12-04 LAB — MRSA NEXT GEN BY PCR, NASAL: MRSA by PCR Next Gen: NOT DETECTED

## 2022-12-04 LAB — HEPARIN LEVEL (UNFRACTIONATED): Heparin Unfractionated: 0.46 IU/mL (ref 0.30–0.70)

## 2022-12-04 MED ORDER — DILTIAZEM LOAD VIA INFUSION
10.0000 mg | Freq: Once | INTRAVENOUS | Status: AC
  Administered 2022-12-04: 10 mg via INTRAVENOUS
  Filled 2022-12-04: qty 10

## 2022-12-04 MED ORDER — HEPARIN (PORCINE) 25000 UT/250ML-% IV SOLN
1600.0000 [IU]/h | INTRAVENOUS | Status: AC
  Administered 2022-12-05: 1600 [IU]/h via INTRAVENOUS
  Filled 2022-12-04: qty 250

## 2022-12-04 MED ORDER — ORAL CARE MOUTH RINSE: 15.0000 mL | OROMUCOSAL | Status: DC | PRN

## 2022-12-04 MED ORDER — CHLORHEXIDINE GLUCONATE CLOTH 2 % EX PADS
6.0000 | MEDICATED_PAD | Freq: Every day | CUTANEOUS | Status: DC
  Administered 2022-12-04 – 2022-12-05 (×2): 6 via TOPICAL

## 2022-12-04 MED ORDER — ACETAMINOPHEN 650 MG RE SUPP: 650.0000 mg | Freq: Four times a day (QID) | RECTAL | Status: DC | PRN

## 2022-12-04 MED ORDER — ALBUTEROL SULFATE (2.5 MG/3ML) 0.083% IN NEBU: 2.5000 mg | INHALATION_SOLUTION | RESPIRATORY_TRACT | Status: DC | PRN

## 2022-12-04 MED ORDER — ONDANSETRON HCL 4 MG/2ML IJ SOLN: 4.0000 mg | Freq: Four times a day (QID) | INTRAMUSCULAR | Status: DC | PRN

## 2022-12-04 MED ORDER — DILTIAZEM HCL-DEXTROSE 125-5 MG/125ML-% IV SOLN (PREMIX)
5.0000 mg/h | INTRAVENOUS | Status: AC
  Administered 2022-12-04: 10 mg/h via INTRAVENOUS
  Administered 2022-12-04: 5 mg/h via INTRAVENOUS
  Filled 2022-12-04 (×4): qty 125

## 2022-12-04 MED ORDER — ASPIRIN 81 MG PO TBEC
81.0000 mg | DELAYED_RELEASE_TABLET | Freq: Every day | ORAL | Status: DC
  Administered 2022-12-04 – 2022-12-06 (×3): 81 mg via ORAL
  Filled 2022-12-04 (×3): qty 1

## 2022-12-04 MED ORDER — ACETAMINOPHEN 325 MG PO TABS
650.0000 mg | ORAL_TABLET | Freq: Four times a day (QID) | ORAL | Status: DC | PRN
  Administered 2022-12-04: 650 mg via ORAL
  Filled 2022-12-04: qty 2

## 2022-12-04 MED ORDER — SENNOSIDES-DOCUSATE SODIUM 8.6-50 MG PO TABS: 1.0000 | ORAL_TABLET | Freq: Every evening | ORAL | Status: DC | PRN

## 2022-12-04 MED ORDER — BENAZEPRIL HCL 20 MG PO TABS
20.0000 mg | ORAL_TABLET | Freq: Every day | ORAL | Status: DC
  Administered 2022-12-04 – 2022-12-06 (×3): 20 mg via ORAL
  Filled 2022-12-04 (×3): qty 1

## 2022-12-04 MED ORDER — HEPARIN BOLUS VIA INFUSION
5000.0000 [IU] | Freq: Once | INTRAVENOUS | Status: AC
  Administered 2022-12-04: 5000 [IU] via INTRAVENOUS
  Filled 2022-12-04: qty 5000

## 2022-12-04 MED ORDER — AMLODIPINE BESYLATE 5 MG PO TABS
5.0000 mg | ORAL_TABLET | Freq: Every day | ORAL | Status: DC
  Administered 2022-12-04: 5 mg via ORAL
  Filled 2022-12-04: qty 1

## 2022-12-04 MED ORDER — HEPARIN (PORCINE) 25000 UT/250ML-% IV SOLN
1300.0000 [IU]/h | INTRAVENOUS | Status: DC
  Administered 2022-12-04: 1300 [IU]/h via INTRAVENOUS
  Filled 2022-12-04: qty 250

## 2022-12-04 MED ORDER — TRAZODONE HCL 50 MG PO TABS: 25.0000 mg | ORAL_TABLET | Freq: Every evening | ORAL | Status: DC | PRN

## 2022-12-04 MED ORDER — IOHEXOL 350 MG/ML SOLN
75.0000 mL | Freq: Once | INTRAVENOUS | Status: AC | PRN
  Administered 2022-12-04: 75 mL via INTRAVENOUS

## 2022-12-04 MED ORDER — ATORVASTATIN CALCIUM 10 MG PO TABS
10.0000 mg | ORAL_TABLET | Freq: Every day | ORAL | Status: DC
  Administered 2022-12-04 – 2022-12-06 (×3): 10 mg via ORAL
  Filled 2022-12-04 (×3): qty 1

## 2022-12-04 MED ORDER — ONDANSETRON HCL 4 MG PO TABS: 4.0000 mg | ORAL_TABLET | Freq: Four times a day (QID) | ORAL | Status: DC | PRN

## 2022-12-04 NOTE — Telephone Encounter (Signed)
Pharmacy Patient Advocate Encounter  Insurance verification completed.    The patient is insured through [employer]   The patient is currently admitted and ran test claims for the following: Eliquis, Xarelto.  Copays and coinsurance results were relayed to Inpatient clinical team.  

## 2022-12-04 NOTE — ED Provider Notes (Signed)
Mathew Sanchez EMERGENCY DEPARTMENT AT [facility] Provider Note   CSN: [id number] Arrival date & time: 12/04/22  4098     History  Chief Complaint  Patient presents with   Shortness of Breath    Mathew Sanchez. is a 67 y.o. male.   Shortness of Breath Associated symptoms: chest pain   Patient presents for chest pain.  Medical history includes HLD, HTN, CKD.  Over the past 2 to 3 days, patient has been experiencing exertional dyspnea, lightheadedness, subjective fevers, in addition to right-sided chest pain.  This morning, while in the shower, he had a transient episode of lightheadedness.  He describes the pain as initiating lower on his right flank and migrating to his left chest.  It is pleuritic and worsened with deep inspiration.  Patient also describes a painful knot on the inside of his left thigh which she noticed 2 days ago and has resolved.  He then had left-sided shin pain.  His left leg pain has resolved.  Today, he called telehealth who advised him to come to the ED to be evaluated for blood clots.  He denies any history of blood clots.  He has no known cardiac history.     Home Medications Prior to Admission medications   Medication Sig Start Date End Date Taking? Authorizing Provider  amLODipine-benazepril (LOTREL) 5-20 MG capsule Take 1 capsule by mouth daily. 05/21/22  Yes Corwin Levins, MD  aspirin 81 MG EC tablet Take 81 mg by mouth daily.   Yes [provider]  atorvastatin (LIPITOR) 10 MG tablet Take 1 tablet (10 mg total) by mouth daily. 05/21/22 05/21/23 Yes Corwin Levins, MD  doxycycline (VIBRA-TABS) 100 MG tablet Take 1 tablet (100 mg total) by mouth 2 (two) times daily. 01/17/22  Yes Corwin Levins, MD      Allergies    Patient has no active allergies.    Review of Systems   Review of Systems  Respiratory:  Positive for shortness of breath.   Cardiovascular:  Positive for chest pain.  Neurological:  Positive for light-headedness.   All other systems reviewed and are negative.   Physical Exam Updated Vital Signs BP (!) 144/89   Pulse 62   Temp 98.5 F (36.9 C) (Oral)   Resp 19   Ht 6\' 3"  (1.905 m)   Wt 93 kg   SpO2 96%   BMI 25.63 kg/m  Physical Exam Vitals and nursing note reviewed.  Constitutional:      General: He is not in acute distress.    Appearance: He is well-developed. He is not ill-appearing, toxic-appearing or diaphoretic.  HENT:     Head: Normocephalic and atraumatic.     Mouth/Throat:     Mouth: Mucous membranes are moist.  Eyes:     Conjunctiva/sclera: Conjunctivae normal.  Cardiovascular:     Rate and Rhythm: Tachycardia present. Rhythm irregular.     Heart sounds: No murmur heard. Pulmonary:     Effort: Pulmonary effort is normal. No tachypnea, accessory muscle usage or respiratory distress.     Breath sounds: Normal breath sounds. No decreased breath sounds, wheezing, rhonchi or rales.  Chest:     Chest wall: No tenderness.  Abdominal:     Palpations: Abdomen is soft.     Tenderness: There is no abdominal tenderness.  Musculoskeletal:        General: No swelling. Normal range of motion.     Cervical back: Normal range of motion and neck  supple.     Right lower leg: No edema.     Left lower leg: No tenderness. No edema.  Skin:    General: Skin is warm and dry.     Coloration: Skin is not cyanotic or pale.  Neurological:     General: No focal deficit present.     Mental Status: He is alert and oriented to person, place, and time.  Psychiatric:        Mood and Affect: Mood normal.        Behavior: Behavior normal.     ED Results / Procedures / Treatments   Labs (all labs ordered are listed, but only abnormal results are displayed) Labs Reviewed  BASIC METABOLIC PANEL - Abnormal; Notable for the following components:      Result Value   Glucose, Bld 130 (*)    Creatinine, Ser 1.40 (*)    Calcium 8.6 (*)    GFR, Estimated 55 (*)    All other components within normal  limits  HEPATIC FUNCTION PANEL - Abnormal; Notable for the following components:   Total Bilirubin 1.6 (*)    Indirect Bilirubin 1.4 (*)    All other components within normal limits  I-STAT CHEM 8, ED - Abnormal; Notable for the following components:   Creatinine, Ser 1.40 (*)    Glucose, Bld 128 (*)    All other components within normal limits  MAGNESIUM  CBC  TSH  BRAIN NATRIURETIC PEPTIDE  PROTIME-INR  HEPARIN LEVEL (UNFRACTIONATED)  HIV ANTIBODY (ROUTINE TESTING W REFLEX)  TROPONIN I (HIGH SENSITIVITY)    EKG None  Radiology VAS [place] LOWER EXTREMITY VENOUS (DVT) (ONLY [facility] & [facility])  Result Date: 12/04/2022  Lower Venous DVT Study Patient Name:  Mathew Sanchez.  Date of Exam:   12/04/2022 Medical Rec #: [id number]               Accession #:    [id number] Date of Birth: 1955/09/25               Patient Gender: M Patient Age:   50 years Exam Location:  [facility] Procedure:      VAS [place] LOWER EXTREMITY VENOUS (DVT) Referring Phys: Gloris Manchester --------------------------------------------------------------------------------  Indications: Pain.  Risk Factors: Suspected PE. Anticoagulation: Heparin. Comparison Study: No prior studies. Performing Technologist: Chanda Busing RVT  Examination Guidelines: A complete evaluation includes B-mode imaging, spectral Doppler, color Doppler, and power Doppler as needed of all accessible portions of each vessel. Bilateral testing is considered an integral part of a complete examination. Limited examinations for reoccurring indications may be performed as noted. The reflux portion of the exam is performed with the patient in reverse Trendelenburg.  +-----+---------------+---------+-----------+----------+--------------+ RIGHTCompressibilityPhasicitySpontaneityPropertiesThrombus Aging +-----+---------------+---------+-----------+----------+--------------+ CFV  Full           Yes      Yes                                  +-----+---------------+---------+-----------+----------+--------------+   +---------+---------------+---------+-----------+----------+--------------+ LEFT     CompressibilityPhasicitySpontaneityPropertiesThrombus Aging +---------+---------------+---------+-----------+----------+--------------+ CFV      Full           Yes      Yes                                 +---------+---------------+---------+-----------+----------+--------------+ SFJ      Full                                                        +---------+---------------+---------+-----------+----------+--------------+  FV Prox  Full                                                        +---------+---------------+---------+-----------+----------+--------------+ FV Mid   Full                                                        +---------+---------------+---------+-----------+----------+--------------+ FV DistalFull                                                        +---------+---------------+---------+-----------+----------+--------------+ PFV      Full                                                        +---------+---------------+---------+-----------+----------+--------------+ POP      Full           Yes      Yes                                 +---------+---------------+---------+-----------+----------+--------------+ PTV      Full                                                        +---------+---------------+---------+-----------+----------+--------------+ PERO     Full                                                        +---------+---------------+---------+-----------+----------+--------------+ Soleal   None                                         Acute          +---------+---------------+---------+-----------+----------+--------------+ Gastroc  None                                         Acute           +---------+---------------+---------+-----------+----------+--------------+ GSV      Full                                         Acute          +---------+---------------+---------+-----------+----------+--------------+ Thrombus located in the GSV is noted to  only be in the proximal calf.   Summary: RIGHT: - No evidence of common femoral vein obstruction.  LEFT: - Findings consistent with acute deep vein thrombosis involving the left soleal veins, and left gastrocnemius veins. - Findings consistent with acute superficial vein thrombosis involving the left great saphenous vein. - No cystic structure found in the popliteal fossa.  *See table(s) above for measurements and observations.    Preliminary    CT Angio Chest PE W and/or Wo Contrast  Result Date: 12/04/2022 CLINICAL DATA:  Right-sided chest pain with shortness of breath. Pulmonary embolism suspected, high probability. EXAM: CT ANGIOGRAPHY CHEST WITH CONTRAST TECHNIQUE: Multidetector CT imaging of the chest was performed using the standard protocol during bolus administration of intravenous contrast. Multiplanar CT image reconstructions and MIPs were obtained to evaluate the vascular anatomy. RADIATION DOSE REDUCTION: This exam was performed according to the departmental dose-optimization program which includes automated exposure control, adjustment of the mA and/or kV according to patient size and/or use of iterative reconstruction technique. CONTRAST:  75mL OMNIPAQUE IOHEXOL 350 MG/ML SOLN COMPARISON:  Chest radiographs same date.  Abdominal CT 07/26/2003. FINDINGS: Cardiovascular: The pulmonary arteries are well opacified with contrast to the level of the subsegmental branches. There are multiple partially obstructing pulmonary emboli, primarily within the segmental and subsegmental branches of the right lower lobe. There is lesser involvement of the right upper lobe and lingula. No more proximal pulmonary emboli are demonstrated. Mild  atherosclerosis of the aorta, great vessels and coronary arteries without acute systemic arterial abnormalities. The heart size is normal. There is no pericardial effusion. Mediastinum/Nodes: There are no enlarged mediastinal, hilar or axillary lymph nodes. The thyroid gland, trachea and esophagus demonstrate no significant findings. Lungs/Pleura: Small right pleural effusion with dependent right lower lobe airspace disease which may reflect atelectasis or an early pulmonary infarct. There is minimal dependent atelectasis in the left lower lobe and lingula. No suspicious pulmonary nodularity. Upper abdomen: Images through the upper abdomen demonstrate numerous nonobstructing bilateral renal calculi. There are bilateral renal cysts for which no follow-up imaging is recommended. Musculoskeletal/Chest wall: There is no chest wall mass or suspicious osseous finding. Mild spondylosis. Review of the MIP images confirms the above findings. IMPRESSION: 1. Multiple partially obstructing pulmonary emboli bilaterally, primarily within the segmental and subsegmental branches of the right lower lobe. 2. Small right pleural effusion with dependent right lower lobe airspace disease which may reflect atelectasis or an early pulmonary infarct. 3. No evidence of right heart strain. 4. Bilateral nephrolithiasis. 5.  Aortic Atherosclerosis (ICD10-I70.0). 6. Critical Value/emergent results were called by telephone at the time of interpretation on 12/04/2022 at 11:45 am to provider [facility] , who verbally acknowledged these results. Electronically Signed   By: Carey Bullocks M.D.   On: 12/04/2022 11:48   DG Chest Port 1 View  Result Date: 12/04/2022 CLINICAL DATA:  Chest pain with shortness of breath and nonproductive cough for 2-3 days. EXAM: PORTABLE CHEST 1 VIEW COMPARISON:  No prior chest radiographs.  Abdominal CT 07/26/2003. FINDINGS: 1054 hours. Lordotic positioning. The heart size and mediastinal contours are normal. New  patchy right basilar airspace disease and a possible small right pleural effusion. The left lung appears clear. No pleural effusion or pneumothorax. No osseous abnormalities are demonstrated. Telemetry leads overlie the chest. IMPRESSION: New patchy right basilar airspace disease and possible small right pleural effusion, suspicious for early pneumonia. Electronically Signed   By: Carey Bullocks M.D.   On: 12/04/2022 11:21    Procedures Procedures  Medications Ordered in ED Medications  diltiazem (CARDIZEM) 1 mg/mL load via infusion 10 mg (10 mg Intravenous Bolus from Bag 12/04/22 1049)    And  diltiazem (CARDIZEM) 125 mg in dextrose 5% 125 mL (1 mg/mL) infusion (5 mg/hr Intravenous New Bag/Given 12/04/22 1048)  heparin ADULT infusion 100 units/mL (25000 units/230mL) (1,600 Units/hr Intravenous Rate/Dose Change 12/04/22 1217)  acetaminophen (TYLENOL) tablet 650 mg (has no administration in time range)    Or  acetaminophen (TYLENOL) suppository 650 mg (has no administration in time range)  traZODone (DESYREL) tablet 25 mg (has no administration in time range)  senna-docusate (Senokot-S) tablet 1 tablet (has no administration in time range)  ondansetron (ZOFRAN) tablet 4 mg (has no administration in time range)    Or  ondansetron (ZOFRAN) injection 4 mg (has no administration in time range)  albuterol (PROVENTIL) (2.5 MG/3ML) 0.083% nebulizer solution 2.5 mg (has no administration in time range)  aspirin EC tablet 81 mg (has no administration in time range)  atorvastatin (LIPITOR) tablet 10 mg (has no administration in time range)  amLODipine (NORVASC) tablet 5 mg (has no administration in time range)    And  benazepril (LOTENSIN) tablet 20 mg (has no administration in time range)  heparin bolus via infusion 5,000 Units (5,000 Units Intravenous Bolus from Bag 12/04/22 1130)  iohexol (OMNIPAQUE) 350 MG/ML injection 75 mL (75 mLs Intravenous Contrast Given 12/04/22 1110)    ED Course/ Medical  Decision Making/ A&P                             Medical Decision Making Amount and/or Complexity of Data Reviewed Labs: ordered. Radiology: ordered.  Risk Prescription drug management. Decision regarding hospitalization.   This patient presents to the ED for concern of chest pain, this involves an extensive number of treatment options, and is a complaint that carries with it a high risk of complications and morbidity.  The differential diagnosis includes PE, ACS, pneumonia, pericarditis, pneumothorax, pleural effusion   Co morbidities that complicate the patient evaluation  HLD, HTN, CKD   Additional history obtained:  Additional history obtained from patient's wife External records from outside source obtained and reviewed including EMR   Lab Tests:  I Ordered, and personally interpreted labs.  The pertinent results include: Baseline creatinine, normal electrolytes, normal hemoglobin, no leukocytosis, normal troponin, normal BNP   Imaging Studies ordered:  I ordered imaging studies including left DVT study, chest x-ray, CTA chest I independently visualized and interpreted imaging which showed left leg positive for DVT, CTA chest showed bilateral pulmonary emboli, small right pleural effusion, and right lower lobe airspace disease suggestive of pulmonary infarct I agree with the radiologist interpretation   Cardiac Monitoring: / EKG:  The patient was maintained on a cardiac monitor.  I personally viewed and interpreted the cardiac monitored which showed an underlying rhythm of: Atrial fibrillation  Problem List / ED Course / Critical interventions / Medication management  Patient presents for 2 to 3 days of lisinopril chest pain, exertional dyspnea, and episode of lightheadedness this morning.  On arrival in the ED, patient is found to be in atrial fibrillation with RVR.  He has no history of this.  He has no known cardiac history.  Despite rapid heart rate, patient is  well-appearing on exam.  He denies sensation of palpitations.  His breathing is unlabored.  SpO2 is normal on room air.  Current blood pressures in the range of 130s over  80s.  Patient was started on diltiazem and heparin.  Workup was initiated.  Lab work is unremarkable.  Imaging did indeed show bilateral PEs as well as left leg DVT. Heparin order was changed for PE dosing per pharmacy.  On 5 mg/h of Cardizem, patient's heart rate had improved to the range of 120.  Blood pressure remained normal.  Patient remained on room air with SpO2 of 97%.  Patient had no worsening of symptoms while in the ED.  He was admitted to medicine for further management. I ordered medication including Cardizem for rate control; heparin for VTE Reevaluation of the patient after these medicines showed that the patient improved I have reviewed the patients home medicines and have made adjustments as needed   Social Determinants of Health:  Has PCP  CRITICAL CARE Performed by: Gloris Manchester   Total critical care time: 35 minutes  Critical care time was exclusive of separately billable procedures and treating other patients.  Critical care was necessary to treat or prevent imminent or life-threatening deterioration.  Critical care was time spent personally by me on the following activities: development of treatment plan with patient and/or surrogate as well as nursing, discussions with consultants, evaluation of patient's response to treatment, examination of patient, obtaining history from patient or surrogate, ordering and performing treatments and interventions, ordering and review of laboratory studies, ordering and review of radiographic studies, pulse oximetry and re-evaluation of patient's condition.         Final Clinical Impression(s) / ED Diagnoses Final diagnoses:  Other acute pulmonary embolism, unspecified whether acute cor pulmonale present (HCC)  Acute deep vein thrombosis (DVT) of left lower  extremity, unspecified vein (HCC)  Atrial fibrillation with RVR (HCC)    Rx / DC Orders ED Discharge Orders          Ordered    Amb referral to AFIB Clinic        12/04/22 1004              Gloris Manchester, MD 12/04/22 1222

## 2022-12-04 NOTE — Progress Notes (Addendum)
ANTICOAGULATION CONSULT NOTE  Pharmacy Consult for IV heparin Indication: Afib, PE  No Active Allergies  Patient Measurements: Height: 6\' 3"  (190.5 cm) Weight: 93 kg (205 lb 0.4 oz) IBW/kg (Calculated) : 84.5 Heparin Dosing Weight: TBW  Vital Signs: Temp: 97.9 F (36.6 C) (05/22 0935) Temp Source: Oral (05/22 0935) BP: 118/80 (05/22 1000) Pulse Rate: 78 (05/22 1005)  Labs: Recent Labs    12/04/22 1003 12/04/22 1026  HGB 14.1 14.6  HCT 42.5 43.0  PLT 228  --   CREATININE  --  1.40*    Estimated Creatinine Clearance: 62 mL/min (A) (by C-G formula based on SCr of 1.4 mg/dL (H)).   Medical History: Past Medical History:  Diagnosis Date   ACNE NEC 07/29/2008   ALLERGIC RHINITIS 07/29/2008   Chronic kidney disease    kidney stones   COLONIC POLYPS, HX OF 07/29/2008   ERECTILE DYSFUNCTION 07/29/2008   HYPERLIPIDEMIA 07/29/2008   HYPERTENSION 07/29/2008   NEPHROLITHIASIS, HX OF 07/29/2008   PSA, INCREASED 07/29/2008    Medications:  (Not in a hospital admission)  Scheduled:   diltiazem  10 mg Intravenous Once   heparin  5,000 Units Intravenous Once   PRN:   Assessment: 22 yoM with PMH HTN, CKD, no known cardiac Hx, presenting with chest pain, DOE, presyncope; found to be in Afib/RVR. Pharmacy consulted to dose IV heparin  Baseline INR, aPTT: not done Prior anticoagulation: none (only ASA 81 mg PTA)  Significant events:  Today, 12/04/2022: CBC: WNL SCr elevated, but appears to be consistent with last reading in Oct 2023 No bleeding or infusion issues per nursing  Goal of Therapy: Heparin level 0.3-0.7 units/ml Monitor platelets by anticoagulation protocol: Yes  Plan: Heparin 5000 units IV bolus x 1 Heparin 1300 units/hr IV infusion Check heparin level 6-8 hrs after start Daily CBC, daily heparin level once stable Monitor for signs of bleeding or thrombosis  Bernadene Person, PharmD, BCPS [phone] 12/04/2022, 10:35 AM   Addendum:  CTA chest reveals  multiple partially obstructing bilateral pulmonary emboli at segmental/subsegmental level; no RH strain.  Plan:  Increase heparin infusion rate to 1600 units/hr No additional bolus Labs as above  Bernadene Person, PharmD, BCPS [phone] 12/04/2022, 11:57 AM

## 2022-12-04 NOTE — Progress Notes (Signed)
ANTICOAGULATION CONSULT NOTE  Pharmacy Consult for IV heparin Indication: Afib, PE  No Active Allergies  Patient Measurements: Height: 6\' 3"  (190.5 cm) Weight: 93 kg (205 lb 0.4 oz) IBW/kg (Calculated) : 84.5 Heparin Dosing Weight: TBW  Vital Signs: Temp: 99.5 F (37.5 C) (05/22 1725) Temp Source: Oral (05/22 1725) BP: 140/74 (05/22 1800) Pulse Rate: 91 (05/22 1800)  Labs: Recent Labs    12/04/22 1003 12/04/22 1026 12/04/22 1419 12/04/22 1753  HGB 14.1 14.6  --   --   HCT 42.5 43.0  --   --   PLT 228  --   --   --   LABPROT 14.9  --   --   --   INR 1.1  --   --   --   HEPARINUNFRC  --   --   --  0.46  CREATININE 1.40* 1.40*  --   --   TROPONINIHS 6  --  4  --      Estimated Creatinine Clearance: 62 mL/min (A) (by C-G formula based on SCr of 1.4 mg/dL (H)).   Medical History: Past Medical History:  Diagnosis Date   ACNE NEC 07/29/2008   ALLERGIC RHINITIS 07/29/2008   Chronic kidney disease    kidney stones   COLONIC POLYPS, HX OF 07/29/2008   ERECTILE DYSFUNCTION 07/29/2008   HYPERLIPIDEMIA 07/29/2008   HYPERTENSION 07/29/2008   NEPHROLITHIASIS, HX OF 07/29/2008   PSA, INCREASED 07/29/2008    Medications:  Medications Prior to Admission  Medication Sig Dispense Refill Last Dose   amLODipine-benazepril (LOTREL) 5-20 MG capsule Take 1 capsule by mouth daily. 90 capsule 3 12/03/2022   aspirin 81 MG EC tablet Take 81 mg by mouth daily.   12/03/2022   atorvastatin (LIPITOR) 10 MG tablet Take 1 tablet (10 mg total) by mouth daily. 90 tablet 3 12/03/2022   doxycycline (VIBRA-TABS) 100 MG tablet Take 1 tablet (100 mg total) by mouth 2 (two) times daily. (Patient not taking: Reported on 12/04/2022) 20 tablet 0 Completed Course   Scheduled:   amLODipine  5 mg Oral Daily   And   benazepril  20 mg Oral Daily   aspirin EC  81 mg Oral Daily   atorvastatin  10 mg Oral Daily   Chlorhexidine Gluconate Cloth  6 each Topical Daily     Assessment: 47 yoM with PMH HTN, CKD,  no known cardiac Hx, presenting with chest pain, DOE, presyncope; found to be in Afib/RVR. CTA chest also reveals multiple partially obstructing bilateral pulmonary emboli at segmental/subsegmental level; no RH strain. Pharmacy consulted to dose IV heparin  Significant events:  Today, 12/04/2022: Heparin level 0.46 - therapeutic on heparin infusion at 1600 units/hr CBC: WNL SCr elevated, but appears to be consistent with last reading in Oct 2023 No bleeding or infusion issues noted  Goal of Therapy: Heparin level 0.3-0.7 units/ml Monitor platelets by anticoagulation protocol: Yes  Plan: Continue heparin infusion at 1600 units/hr Check heparin level ~ 6 hrs to confirm Daily CBC, daily heparin level once stable Monitor for signs of bleeding or thrombosis  Pricilla Riffle, PharmD, BCPS Clinical Pharmacist 12/04/2022 7:20 PM

## 2022-12-04 NOTE — Progress Notes (Signed)
Left lower extremity venous duplex has been completed. Preliminary results can be found in CV Proc through chart review.  Results were given to Dr. Durwin Nora.  12/04/22 11:48 AM Olen Cordial RVT

## 2022-12-04 NOTE — H&P (Signed)
History and Physical  Mathew Sanchez. WUJ:[id number] DOB: May 25, 1956 DOA: 12/04/2022  PCP: Corwin Levins, MD   Chief Complaint: chest pain   HPI: Mathew Sanchez. is a 67 y.o. male with medical history significant for hypertension, hyperlipidemia being admitted to the hospital with acute submassive PE and left lower extremity DVT.  Patient has been in his usual state of health until about 3 to 4 days ago, when he noticed he had some pain in the left upper inner thigh.  There seem to be a sore spot there and a small bump.  It went away after less than the day, after which she started having some left-sided pleuritic chest pain in the last 48 hours.  The pain seemed to get better if he sat upright, but was worse with inspiration when he leaned back.  Had a telehealth visit with urgent care today, and was advised to come to the ER.  ED Course: In the emergency department, he was found to be in rapid A-fib, hemodynamically stable with normal vital signs except for tachycardia as high as 154.  CT scan of the chest as well as left lower extremity ultrasound were performed with results as listed below.  He was placed on Cardizem drip currently going at 5 mcg/h, as well as IV heparin drip.  Notably, denies any abdominal pain, nausea, vomiting, weight loss.  No early satiety or change in bowel or bladder habits.  He is up-to-date on cancer screening, had a normal colonoscopy 9 years ago is due for repeat colonoscopy next year.  He does travel for work, but drives only up to about 3 to 4 hours at a time.  No recent plane travel.  Has not been sedentary recently.  Review of Systems: Please see HPI for pertinent positives and negatives. A complete 10 system review of systems are otherwise negative.  Past Medical History:  Diagnosis Date   ACNE NEC 07/29/2008   ALLERGIC RHINITIS 07/29/2008   Chronic kidney disease    kidney stones   COLONIC POLYPS, HX OF 07/29/2008   ERECTILE DYSFUNCTION  07/29/2008   HYPERLIPIDEMIA 07/29/2008   HYPERTENSION 07/29/2008   NEPHROLITHIASIS, HX OF 07/29/2008   PSA, INCREASED 07/29/2008   Past Surgical History:  Procedure Laterality Date   COLONOSCOPY     POLYPECTOMY     TONSILLECTOMY      Social History:  reports that he has never smoked. He has never used smokeless tobacco. He reports current alcohol use. He reports that he does not use drugs.   No Active Allergies  Family History  Problem Relation Age of Onset   Colon cancer Neg Hx    Rectal cancer Neg Hx    Stomach cancer Neg Hx    Esophageal cancer Neg Hx      Prior to Admission medications   Medication Sig Start Date End Date Taking? Authorizing Provider  amLODipine-benazepril (LOTREL) 5-20 MG capsule Take 1 capsule by mouth daily. 05/21/22  Yes Corwin Levins, MD  aspirin 81 MG EC tablet Take 81 mg by mouth daily.   Yes [provider]  atorvastatin (LIPITOR) 10 MG tablet Take 1 tablet (10 mg total) by mouth daily. 05/21/22 05/21/23 Yes Corwin Levins, MD  doxycycline (VIBRA-TABS) 100 MG tablet Take 1 tablet (100 mg total) by mouth 2 (two) times daily. 01/17/22  Yes Corwin Levins, MD    Physical Exam: BP 134/86   Pulse (!) 55   Temp 97.9 F (36.6 C) (  Oral)   Resp 11   Ht 6\' 3"  (1.905 m)   Wt 93 kg   SpO2 97%   BMI 25.63 kg/m   General:  Alert, oriented, calm, in no acute distress  Eyes: EOMI, clear conjuctivae, white sclerea Neck: supple, no masses, trachea mildline  Cardiovascular: RRR, no murmurs or rubs, no peripheral edema  Respiratory: clear to auscultation bilaterally, no wheezes, no crackles  Abdomen: soft, nontender, nondistended, normal bowel tones heard  Skin: dry, no rashes  Musculoskeletal: no joint effusions, normal range of motion, no lower extremity edema, no left leg tenderness Psychiatric: appropriate affect, normal speech  Neurologic: extraocular muscles intact, clear speech, moving all extremities with intact sensorium          Labs on  Admission:  Basic Metabolic Panel: Recent Labs  Lab 12/04/22 1003 12/04/22 1026  NA 137 140  K 4.0 4.1  CL 105 105  CO2 23  --   GLUCOSE 130* 128*  BUN 19 18  CREATININE 1.40* 1.40*  CALCIUM 8.6*  --   MG 2.1  --    Liver Function Tests: Recent Labs  Lab 12/04/22 1003  AST 15  ALT 14  ALKPHOS 66  BILITOT 1.6*  PROT 7.2  ALBUMIN 3.6   No results for input(s): "LIPASE", "AMYLASE" in the last 168 hours. No results for input(s): "AMMONIA" in the last 168 hours. CBC: Recent Labs  Lab 12/04/22 1003 12/04/22 1026  WBC 9.3  --   HGB 14.1 14.6  HCT 42.5 43.0  MCV 95.1  --   PLT 228  --    Cardiac Enzymes: No results for input(s): "CKTOTAL", "CKMB", "CKMBINDEX", "TROPONINI" in the last 168 hours.  BNP (last 3 results) Recent Labs    12/04/22 1003  BNP 38.3    ProBNP (last 3 results) No results for input(s): "PROBNP" in the last 8760 hours.  CBG: No results for input(s): "GLUCAP" in the last 168 hours.  Radiological Exams on Admission: VAS [place] LOWER EXTREMITY VENOUS (DVT) (ONLY [facility] & [facility])  Result Date: 12/04/2022  Lower Venous DVT Study Patient Name:  Mathew Sanchez.  Date of Exam:   12/04/2022 Medical Rec #: [id number]               Accession #:    [id number] Date of Birth: Aug 20, 1955               Patient Gender: M Patient Age:   42 years Exam Location:  [facility] Procedure:      VAS [place] LOWER EXTREMITY VENOUS (DVT) Referring Phys: Gloris Manchester --------------------------------------------------------------------------------  Indications: Pain.  Risk Factors: Suspected PE. Anticoagulation: Heparin. Comparison Study: No prior studies. Performing Technologist: Chanda Busing RVT  Examination Guidelines: A complete evaluation includes B-mode imaging, spectral Doppler, color Doppler, and power Doppler as needed of all accessible portions of each vessel. Bilateral testing is considered an integral part of a complete examination. Limited examinations for  reoccurring indications may be performed as noted. The reflux portion of the exam is performed with the patient in reverse Trendelenburg.  +-----+---------------+---------+-----------+----------+--------------+ RIGHTCompressibilityPhasicitySpontaneityPropertiesThrombus Aging +-----+---------------+---------+-----------+----------+--------------+ CFV  Full           Yes      Yes                                 +-----+---------------+---------+-----------+----------+--------------+   +---------+---------------+---------+-----------+----------+--------------+ LEFT     CompressibilityPhasicitySpontaneityPropertiesThrombus Aging +---------+---------------+---------+-----------+----------+--------------+ CFV  Full           Yes      Yes                                 +---------+---------------+---------+-----------+----------+--------------+ SFJ      Full                                                        +---------+---------------+---------+-----------+----------+--------------+ FV Prox  Full                                                        +---------+---------------+---------+-----------+----------+--------------+ FV Mid   Full                                                        +---------+---------------+---------+-----------+----------+--------------+ FV DistalFull                                                        +---------+---------------+---------+-----------+----------+--------------+ PFV      Full                                                        +---------+---------------+---------+-----------+----------+--------------+ POP      Full           Yes      Yes                                 +---------+---------------+---------+-----------+----------+--------------+ PTV      Full                                                        +---------+---------------+---------+-----------+----------+--------------+  PERO     Full                                                        +---------+---------------+---------+-----------+----------+--------------+ Soleal   None                                         Acute          +---------+---------------+---------+-----------+----------+--------------+ Gastroc  None  Acute          +---------+---------------+---------+-----------+----------+--------------+ GSV      Full                                         Acute          +---------+---------------+---------+-----------+----------+--------------+ Thrombus located in the GSV is noted to only be in the proximal calf.   Summary: RIGHT: - No evidence of common femoral vein obstruction.  LEFT: - Findings consistent with acute deep vein thrombosis involving the left soleal veins, and left gastrocnemius veins. - Findings consistent with acute superficial vein thrombosis involving the left great saphenous vein. - No cystic structure found in the popliteal fossa.  *See table(s) above for measurements and observations.    Preliminary    CT Angio Chest PE W and/or Wo Contrast  Result Date: 12/04/2022 CLINICAL DATA:  Right-sided chest pain with shortness of breath. Pulmonary embolism suspected, high probability. EXAM: CT ANGIOGRAPHY CHEST WITH CONTRAST TECHNIQUE: Multidetector CT imaging of the chest was performed using the standard protocol during bolus administration of intravenous contrast. Multiplanar CT image reconstructions and MIPs were obtained to evaluate the vascular anatomy. RADIATION DOSE REDUCTION: This exam was performed according to the departmental dose-optimization program which includes automated exposure control, adjustment of the mA and/or kV according to patient size and/or use of iterative reconstruction technique. CONTRAST:  75mL OMNIPAQUE IOHEXOL 350 MG/ML SOLN COMPARISON:  Chest radiographs same date.  Abdominal CT 07/26/2003. FINDINGS:  Cardiovascular: The pulmonary arteries are well opacified with contrast to the level of the subsegmental branches. There are multiple partially obstructing pulmonary emboli, primarily within the segmental and subsegmental branches of the right lower lobe. There is lesser involvement of the right upper lobe and lingula. No more proximal pulmonary emboli are demonstrated. Mild atherosclerosis of the aorta, great vessels and coronary arteries without acute systemic arterial abnormalities. The heart size is normal. There is no pericardial effusion. Mediastinum/Nodes: There are no enlarged mediastinal, hilar or axillary lymph nodes. The thyroid gland, trachea and esophagus demonstrate no significant findings. Lungs/Pleura: Small right pleural effusion with dependent right lower lobe airspace disease which may reflect atelectasis or an early pulmonary infarct. There is minimal dependent atelectasis in the left lower lobe and lingula. No suspicious pulmonary nodularity. Upper abdomen: Images through the upper abdomen demonstrate numerous nonobstructing bilateral renal calculi. There are bilateral renal cysts for which no follow-up imaging is recommended. Musculoskeletal/Chest wall: There is no chest wall mass or suspicious osseous finding. Mild spondylosis. Review of the MIP images confirms the above findings. IMPRESSION: 1. Multiple partially obstructing pulmonary emboli bilaterally, primarily within the segmental and subsegmental branches of the right lower lobe. 2. Small right pleural effusion with dependent right lower lobe airspace disease which may reflect atelectasis or an early pulmonary infarct. 3. No evidence of right heart strain. 4. Bilateral nephrolithiasis. 5.  Aortic Atherosclerosis (ICD10-I70.0). 6. Critical Value/emergent results were called by telephone at the time of interpretation on 12/04/2022 at 11:45 am to provider [facility] , who verbally acknowledged these results. Electronically Signed   By:  Carey Bullocks M.D.   On: 12/04/2022 11:48   DG Chest Port 1 View  Result Date: 12/04/2022 CLINICAL DATA:  Chest pain with shortness of breath and nonproductive cough for 2-3 days. EXAM: PORTABLE CHEST 1 VIEW COMPARISON:  No prior chest radiographs.  Abdominal CT 07/26/2003. FINDINGS: 1054 hours. Lordotic positioning.  The heart size and mediastinal contours are normal. New patchy right basilar airspace disease and a possible small right pleural effusion. The left lung appears clear. No pleural effusion or pneumothorax. No osseous abnormalities are demonstrated. Telemetry leads overlie the chest. IMPRESSION: New patchy right basilar airspace disease and possible small right pleural effusion, suspicious for early pneumonia. Electronically Signed   By: Carey Bullocks M.D.   On: 12/04/2022 11:21    Assessment/Plan This is an active 67 year old gentleman with a history of hypertension, hyperlipidemia being admitted to the hospital with several days of left lower extremity discomfort, and pleuritic chest pain found to have left lower extremity DVT, acute PE, and new onset atrial fibrillation with RVR.  Etiology of his DVT and PE is unclear at this time.  He has no prior history of atrial fibrillation, his new onset A-fib is presumably a result of his acute PE.  A-fib RVR-hemodynamically stable, with stable blood pressure, improved heart rate -Inpatient admission to stepdown unit -Continue home daily aspirin -Cardizem drip, titrate per protocol -Check 2D echo -Note normal TSH and electrolytes -Anticoagulation for VTE as below  CKD stage III-renal function appears to be at baseline  HLD (hyperlipidemia)-continue home statin   Essential hypertension-continue home amlodipine and benazepril in the a.m.  Pulmonary embolism and VTE (venous thromboembolism) -No evidence of heart strain, hypotension, etc. -Continue IV heparin drip (VTE protocol) started in the ER, anticipate transition to Eliquis/Xarelto  in the morning -Follow-up 2D echo as above  DVT prophylaxis: On heparin drip    Code Status: Full Code  Consults called: None  Admission status: The appropriate patient status for this patient is INPATIENT. Inpatient status is judged to be reasonable and necessary in order to provide the required intensity of service to ensure the patient's safety. The patient's presenting symptoms, physical exam findings, and initial radiographic and laboratory data in the context of their chronic comorbidities is felt to place them at high risk for further clinical deterioration. Furthermore, it is not anticipated that the patient will be medically stable for discharge from the hospital within 2 midnights of admission.    I certify that at the point of admission it is my clinical judgment that the patient will require inpatient hospital care spanning beyond 2 midnights from the point of admission due to high intensity of service, high risk for further deterioration and high frequency of surveillance required  Time spent: 56 minutes  Jessiah Wojnar Sharlette Dense MD [facility] Pager [phone]  If 7PM-7AM, please contact night-coverage [email or web address] Password [facility]  12/04/2022, 12:15 PM

## 2022-12-04 NOTE — TOC Benefit Eligibility Note (Signed)
Patient Product/process development scientist completed.    The patient is currently admitted and upon discharge could be taking Eliquis 5 mg.  The current 30 day co-pay is $75.00.   The patient is currently admitted and upon discharge could be taking Xarelto 20 mg.  The current 30 day co-pay is $75.00.   The patient is insured through [employer]   This test claim was processed through [employer]- copay amounts may vary at other pharmacies due to pharmacy/plan contracts, or as the patient moves through the different stages of their insurance plan.  Roland Earl, CPHT Pharmacy Patient Advocate Specialist [facility] Patient Advocate Team Direct Number: [phone]  Fax: [phone]

## 2022-12-04 NOTE — ED Triage Notes (Signed)
C/o right sided abd pain and sob that is worse when laying flat.  Pain improved with standing.

## 2022-12-05 ENCOUNTER — Inpatient Hospital Stay (HOSPITAL_COMMUNITY): Payer: BC Managed Care – PPO

## 2022-12-05 DIAGNOSIS — I4891 Unspecified atrial fibrillation: Secondary | ICD-10-CM | POA: Diagnosis not present

## 2022-12-05 LAB — ECHOCARDIOGRAM COMPLETE
Area-P 1/2: 3.5 cm2
Calc EF: 59.9 %
Height: 75 in
MV M vel: 1.93 m/s
MV Peak grad: 14.9 mmHg
S' Lateral: 3 cm
Single Plane A2C EF: 64.4 %
Single Plane A4C EF: 57 %
Weight: 3280.44 oz

## 2022-12-05 LAB — CBC
HCT: 42.2 % (ref 39.0–52.0)
Hemoglobin: 14.1 g/dL (ref 13.0–17.0)
MCH: 31.8 pg (ref 26.0–34.0)
MCHC: 33.4 g/dL (ref 30.0–36.0)
MCV: 95.3 fL (ref 80.0–100.0)
Platelets: 226 10*3/uL (ref 150–400)
RBC: 4.43 MIL/uL (ref 4.22–5.81)
RDW: 12.2 % (ref 11.5–15.5)
WBC: 9.4 10*3/uL (ref 4.0–10.5)
nRBC: 0 % (ref 0.0–0.2)

## 2022-12-05 LAB — COMPREHENSIVE METABOLIC PANEL
ALT: 13 U/L (ref 0–44)
AST: 13 U/L — ABNORMAL LOW (ref 15–41)
Albumin: 3.3 g/dL — ABNORMAL LOW (ref 3.5–5.0)
Alkaline Phosphatase: 62 U/L (ref 38–126)
Anion gap: 9 (ref 5–15)
BUN: 22 mg/dL (ref 8–23)
CO2: 23 mmol/L (ref 22–32)
Calcium: 8.3 mg/dL — ABNORMAL LOW (ref 8.9–10.3)
Chloride: 107 mmol/L (ref 98–111)
Creatinine, Ser: 1.31 mg/dL — ABNORMAL HIGH (ref 0.61–1.24)
GFR, Estimated: >60 mL/min (ref >60)
Glucose, Bld: 108 mg/dL — ABNORMAL HIGH (ref 70–99)
Potassium: 3.9 mmol/L (ref 3.5–5.1)
Sodium: 139 mmol/L (ref 135–145)
Total Bilirubin: 0.8 mg/dL (ref 0.3–1.2)
Total Protein: 6.9 g/dL (ref 6.5–8.1)

## 2022-12-05 LAB — HEPARIN LEVEL (UNFRACTIONATED): Heparin Unfractionated: 0.49 IU/mL (ref 0.30–0.70)

## 2022-12-05 MED ORDER — APIXABAN 5 MG PO TABS
10.0000 mg | ORAL_TABLET | Freq: Two times a day (BID) | ORAL | Status: DC
  Administered 2022-12-05 – 2022-12-06 (×3): 10 mg via ORAL
  Filled 2022-12-05 (×3): qty 2

## 2022-12-05 MED ORDER — DILTIAZEM HCL 30 MG PO TABS
30.0000 mg | ORAL_TABLET | Freq: Four times a day (QID) | ORAL | Status: DC
  Administered 2022-12-05 – 2022-12-06 (×5): 30 mg via ORAL
  Filled 2022-12-05 (×5): qty 1

## 2022-12-05 MED ORDER — APIXABAN 5 MG PO TABS: 5.0000 mg | ORAL_TABLET | Freq: Two times a day (BID) | ORAL | Status: DC

## 2022-12-05 NOTE — Progress Notes (Addendum)
Patient arrived to room from ICU via wheelchair and was able to ambulate to the bed. All VS WNL at this time and patient currently in normal sinus rhythm. No SOB with exertion. Wife and daughter present at bedside. Pt with no needs at this time, call bell within reach.

## 2022-12-05 NOTE — Progress Notes (Signed)
ANTICOAGULATION CONSULT NOTE  Pharmacy Consult for IV heparin Indication: Afib, PE  No Active Allergies  Patient Measurements: Height: 6\' 3"  (190.5 cm) Weight: 93 kg (205 lb 0.4 oz) IBW/kg (Calculated) : 84.5 Heparin Dosing Weight: TBW  Vital Signs: Temp: 98.1 F (36.7 C) (05/23 0000) Temp Source: Oral (05/23 0000) BP: 127/70 (05/23 0000) Pulse Rate: 82 (05/23 0000)  Labs: Recent Labs    12/04/22 1003 12/04/22 1026 12/04/22 1419 12/04/22 1753 12/05/22 0051  HGB 14.1 14.6  --   --  14.1  HCT 42.5 43.0  --   --  42.2  PLT 228  --   --   --  226  LABPROT 14.9  --   --   --   --   INR 1.1  --   --   --   --   HEPARINUNFRC  --   --   --  0.46 0.49  CREATININE 1.40* 1.40*  --   --  1.31*  TROPONINIHS 6  --  4  --   --      Estimated Creatinine Clearance: 66.3 mL/min (A) (by C-G formula based on SCr of 1.31 mg/dL (H)).   Medical History: Past Medical History:  Diagnosis Date   ACNE NEC 07/29/2008   ALLERGIC RHINITIS 07/29/2008   Chronic kidney disease    kidney stones   COLONIC POLYPS, HX OF 07/29/2008   ERECTILE DYSFUNCTION 07/29/2008   HYPERLIPIDEMIA 07/29/2008   HYPERTENSION 07/29/2008   NEPHROLITHIASIS, HX OF 07/29/2008   PSA, INCREASED 07/29/2008    Medications:  Medications Prior to Admission  Medication Sig Dispense Refill Last Dose   amLODipine-benazepril (LOTREL) 5-20 MG capsule Take 1 capsule by mouth daily. 90 capsule 3 12/03/2022   aspirin 81 MG EC tablet Take 81 mg by mouth daily.   12/03/2022   atorvastatin (LIPITOR) 10 MG tablet Take 1 tablet (10 mg total) by mouth daily. 90 tablet 3 12/03/2022   doxycycline (VIBRA-TABS) 100 MG tablet Take 1 tablet (100 mg total) by mouth 2 (two) times daily. (Patient not taking: Reported on 12/04/2022) 20 tablet 0 Completed Course   Scheduled:   amLODipine  5 mg Oral Daily   And   benazepril  20 mg Oral Daily   aspirin EC  81 mg Oral Daily   atorvastatin  10 mg Oral Daily   Chlorhexidine Gluconate Cloth  6 each  Topical Daily     Assessment: 64 yoM with PMH HTN, CKD, no known cardiac Hx, presenting with chest pain, DOE, presyncope; found to be in Afib/RVR. CTA chest also reveals multiple partially obstructing bilateral pulmonary emboli at segmental/subsegmental level; no RH strain. Pharmacy consulted to dose IV heparin  Significant events:  Today, 12/05/2022: Heparin level 0.49 - therapeutic on heparin infusion at 1600 units/hr CBC: WNL SCr 1.3  No bleeding or infusion issues noted  Goal of Therapy: Heparin level 0.3-0.7 units/ml Monitor platelets by anticoagulation protocol: Yes  Plan: Continue heparin infusion at 1600 units/hr Daily CBC, daily heparin level   Monitor for signs of bleeding or thrombosis  Maryellen Pile, PharmD 12/05/2022 1:53 AM

## 2022-12-05 NOTE — Progress Notes (Signed)
  Echocardiogram 2D Echocardiogram has been performed.  Mathew Sanchez 12/05/2022, 4:06 PM

## 2022-12-05 NOTE — Discharge Instructions (Addendum)
Information on my medicine - ELIQUIS (apixaban)  Why was Eliquis prescribed for you? Eliquis was prescribed to treat blood clots that may have been found in the veins of your legs (deep vein thrombosis) or in your lungs (pulmonary embolism) and to reduce the risk of them occurring again.  What do You need to know about Eliquis ? The starting dose is 10 mg (two 5 mg tablets) taken TWICE daily for the FIRST SEVEN (7) DAYS, then on 12/12/2022  the dose is reduced to ONE 5 mg tablet taken TWICE daily.  Eliquis may be taken with or without food.   Try to take the dose about the same time in the morning and in the evening. If you have difficulty swallowing the tablet whole please discuss with your pharmacist how to take the medication safely.  Take Eliquis exactly as prescribed and DO NOT stop taking Eliquis without talking to the doctor who prescribed the medication.  Stopping may increase your risk of developing a new blood clot.  Refill your prescription before you run out.  After discharge, you should have regular check-up appointments with your healthcare provider that is prescribing your Eliquis.    What do you do if you miss a dose? If a dose of ELIQUIS is not taken at the scheduled time, take it as soon as possible on the same day and twice-daily administration should be resumed. The dose should not be doubled to make up for a missed dose.  Important Safety Information A possible side effect of Eliquis is bleeding. You should call your healthcare provider right away if you experience any of the following: Bleeding from an injury or your nose that does not stop. Unusual colored urine (red or dark brown) or unusual colored stools (red or black). Unusual bruising for unknown reasons. A serious fall or if you hit your head (even if there is no bleeding).  Some medicines may interact with Eliquis and might increase your risk of bleeding or clotting while on Eliquis. To help avoid  this, consult your healthcare provider or pharmacist prior to using any new prescription or non-prescription medications, including herbals, vitamins, non-steroidal anti-inflammatory drugs (NSAIDs) and supplements.  This website has more information on Eliquis (apixaban): http://www.eliquis.com/eliquis/home

## 2022-12-05 NOTE — Progress Notes (Signed)
Initial Nutrition Assessment  DOCUMENTATION CODES:   Not applicable  INTERVENTION:  - Regular diet. - Monitor weight trends.   Nutrition status stable at this time, will sign off. Please re-consult if needed.   NUTRITION DIAGNOSIS:   Increased nutrient needs related to acute illness as evidenced by estimated needs.  GOAL:   Patient will meet greater than or equal to 90% of their needs  MONITOR:   PO intake, Weight trends  REASON FOR ASSESSMENT:   Malnutrition Screening Tool    ASSESSMENT:   67 y.o. male with PMH HTN, HLD, CKD stage IIIa who presented with complaint of right-sided abdominal pain, shortness of breath exacerbated while lying flat. Admitted for new onset A-fib.   Patient endorses UBW of 200# and that he may have lost a few pounds over the last couple days due to not eating well. No other changes within the past year. Per EMR, patient weighed at 204# in October and weighed this admission at 205#.   Typically eats 2 meals a day at home with normal appetite. Has been eating smaller portions the last few days with decreased appetite.   Thankfully, appetite has returned to normal and patient reports eating well at breakfast this morning. Doesn't love the food so planning to have wife bring in other food choices. Encouraged patient to eat well during admission. No other questions or concerns. Nutrition status stable at this time, will sign off.   Medications reviewed and include: -  Labs reviewed:  Creatinine 1.31   NUTRITION - FOCUSED PHYSICAL EXAM:  Flowsheet Row Most Recent Value  Orbital Region No depletion  Upper Arm Region Mild depletion  Thoracic and Lumbar Region No depletion  Buccal Region No depletion  Temple Region No depletion  Clavicle Bone Region Mild depletion  Clavicle and Acromion Bone Region No depletion  Scapular Bone Region Unable to assess  Dorsal Hand No depletion  Patellar Region No depletion  Anterior Thigh Region No depletion   Posterior Calf Region No depletion  Edema (RD Assessment) None  Hair Reviewed  Eyes Reviewed  Mouth Reviewed  Skin Reviewed  Nails Reviewed       Diet Order:   Diet Order             Diet regular Room service appropriate? Yes; Fluid consistency: Thin  Diet effective now                   EDUCATION NEEDS:  Education needs have been addressed  Skin:  Skin Assessment: Reviewed RN Assessment  Last BM:  PTA  Height:  Ht Readings from Last 1 Encounters:  12/04/22 6\' 3"  (1.905 m)   Weight:  Wt Readings from Last 1 Encounters:  12/04/22 93 kg    BMI:  Body mass index is 25.63 kg/m.  Estimated Nutritional Needs:  Kcal:  2150-2350 kcals Protein:  95-110 grams Fluid:  >/= 2.1L    Shelle Iron RD, LDN For contact information, refer to [facility].

## 2022-12-05 NOTE — Progress Notes (Signed)
PROGRESS NOTE    Mathew Sanchez.  ZOX:[id number] DOB: 09-Sep-1955 DOA: 12/04/2022 PCP: Corwin Levins, MD    Brief Narrative:   Mathew Mantey. is a 67 y.o. male with past medical history significant for HTN, HLD, CKD stage IIIa who presented to [facility] ED on 5/22 with complaint of right-sided abdominal pain, shortness of breath exacerbated while lying flat.  Patient reports usual state of health until 3-4 days prior to admission when he noticed some pain in his left upper inner thigh.  Patient reports pain subsided after less than a day in which she started having some left-sided pleuritic chest pain over the last 48 hours.  Pain improved when sitting upright but worse with inspiration and lying on his back.  Patient had telehealth visit with urgent care and was advised to seek further evaluation in the ED.  In the ED, temperature 97.9 F, HR 154, RR 18, BP 145/81, SpO2 96% on room air.  WBC 9.3, hemoglobin 14.1, platelets 228.  Sodium 137, potassium 4.0, chloride 105, CO2 23, glucose 130, BUN 19, creatinine 1.40.  AST 15, ALT 14, total bilirubin 1.4.  High sensitive troponin 6 followed by 4.  BNP 38.3.  TSH 1.381.  INR 1.1.  Urinalysis unrevealing.  Chest x-ray with new patchy right basilar airspace disease and possible small right pleural effusion.  CT angiogram chest with multiple partially obstructing pulmonary emboli bilaterally prominently within the segmental and subsegmental branches of the right lower lobe, small right pleural effusion with dependent right lower lobe airspace disease consistent with atelectasis or early pulmonary infarct, no evidence of right heart strain.  Patient was started on a Cardizem drip for A-fib with RVR, heparin drip for acute PE.  TRH consulted for admission for further evaluation management of new onset A-fib with RVR and pulmonary embolism.  Assessment & Plan:   Atrial fibrillation with RVR, new onset On arrival to the ED, patient was  noted to be tachycardic with EKG/telemetry confirming A-fib with RVR.  Patient initially was started on a Cardizem and heparin drip with improved heart rate control.  Patient denies any previous history of A-fib.  TSH within normal limits. -- TTE: Pending -- Transition Cardizem drip to Cardizem 30 mg p.o. every 6 hours -- Transition heparin drip to Eliquis -- Continue to monitor on telemetry -- Plan to refer to cardiology outpatient for further management  Pulmonary embolism Left lower extremity DVT CT angiogram chest with multiple partially obstructing pulmonary emboli bilaterally prominently within the segmental and subsegmental branches of the right lower lobe, small right pleural effusion with dependent right lower lobe airspace disease consistent with atelectasis or early pulmonary infarct, no evidence of right heart strain.  Vascular duplex left lower extremity bilateral lower extremities with acute DVT left soleal and left gastrocnemius veins, acute superficial vein thrombosis left great saphenous vein. -- Heparin drip transitioning to Eliquis today  Essential hypertension -- Discontinued home amlodipine as transitioning to Cardizem 30 mg p.o. every 6 hours today -- Continue benazepril 20 mg p.o. daily -- Continue aspirin and statin  Hyperlipidemia -- Atorvastatin 10 mg p.o. daily  CKD stage IIIa Baseline creatinine 1.4, stable.  DVT prophylaxis:  apixaban (ELIQUIS) tablet 10 mg  apixaban (ELIQUIS) tablet 5 mg    Code Status: Full Code Family Communication:   Disposition Plan:  Level of care: Stepdown Status is: Inpatient Remains inpatient appropriate because: Transitioning heparin and Cardizem drip today, needs further monitoring of heart rate to ensure optimal  control before ready for discharge home.  Anticipate discharge in 1-2 days    Consultants:  None  Procedures:  Vascular duplex ultrasound left lower extremity TTE: Pending  Antimicrobials:   None   Subjective: Patient seen examined at bedside, resting comfortably.  Lying in bed.  Discussed with patient findings of CT scan, Doppler ultrasound lower extremity to include acute PE and DVT.  Also discussed with patient findings of new onset atrial fibrillation, he reports has been asymptomatic denies previous history of arrhythmia.  Patient concerned about having to be on "medications" for a long period of time.  Discussed with patient risk factors of pulmonary embolism including atrial fibrillation.  Plan to transition off of Cardizem drip and heparin drip today.  No other questions, concerns or complaints this morning.  Denies headache, no dizziness, no chest pain, no palpitations, no current shortness of breath, no fever/chills/night sweats, no nausea/vomiting/diarrhea, no focal weakness, no fatigue, no paresthesias.  No acute events overnight per nursing staff.  Objective: Vitals:   12/05/22 0800 12/05/22 0805 12/05/22 0900 12/05/22 1000  BP: (!) 142/97 126/67 119/85 133/74  Pulse: 76 71 79 65  Resp: 15 15  11   Temp:  97.8 F (36.6 C)    TempSrc:  Oral    SpO2: 94% 94%  94%  Weight:      Height:        Intake/Output Summary (Last 24 hours) at 12/05/2022 1037 Last data filed at 12/05/2022 1013 Gross per 24 hour  Intake 560.19 ml  Output --  Net 560.19 ml   Filed Weights   12/04/22 0938  Weight: 93 kg    Examination:  Physical Exam: GEN: NAD, alert and oriented x 3, wd/wn HEENT: NCAT, PERRL, EOMI, sclera clear, MMM PULM: CTAB w/o wheezes/crackles, normal respiratory effort, on room air CV: Irregularly irregular rhythm, normal rate w/o M/G/R GI: abd soft, NTND, + BS MSK: no peripheral edema, moves all extremities independently NEURO: No focal neural PSYCH: normal mood/affect Integumentary: No concerning rashes/lesions/wounds noted on exposed skin surfaces    Data Reviewed: I have personally reviewed following labs and imaging studies  CBC: Recent Labs  Lab  12/04/22 1003 12/04/22 1026 12/05/22 0051  WBC 9.3  --  9.4  HGB 14.1 14.6 14.1  HCT 42.5 43.0 42.2  MCV 95.1  --  95.3  PLT 228  --  226   Basic Metabolic Panel: Recent Labs  Lab 12/04/22 1003 12/04/22 1026 12/05/22 0051  NA 137 140 139  K 4.0 4.1 3.9  CL 105 105 107  CO2 23  --  23  GLUCOSE 130* 128* 108*  BUN 19 18 22   CREATININE 1.40* 1.40* 1.31*  CALCIUM 8.6*  --  8.3*  MG 2.1  --   --    GFR: Estimated Creatinine Clearance: 66.3 mL/min (A) (by C-G formula based on SCr of 1.31 mg/dL (H)). Liver Function Tests: Recent Labs  Lab 12/04/22 1003 12/05/22 0051  AST 15 13*  ALT 14 13  ALKPHOS 66 62  BILITOT 1.6* 0.8  PROT 7.2 6.9  ALBUMIN 3.6 3.3*   No results for input(s): "LIPASE", "AMYLASE" in the last 168 hours. No results for input(s): "AMMONIA" in the last 168 hours. Coagulation Profile: Recent Labs  Lab 12/04/22 1003  INR 1.1   Cardiac Enzymes: No results for input(s): "CKTOTAL", "CKMB", "CKMBINDEX", "TROPONINI" in the last 168 hours. BNP (last 3 results) No results for input(s): "PROBNP" in the last 8760 hours. HbA1C: No results for input(s): "HGBA1C"  in the last 72 hours. CBG: No results for input(s): "GLUCAP" in the last 168 hours. Lipid Profile: No results for input(s): "CHOL", "HDL", "LDLCALC", "TRIG", "CHOLHDL", "LDLDIRECT" in the last 72 hours. Thyroid Function Tests: Recent Labs    12/04/22 1003  TSH 1.381   Anemia Panel: No results for input(s): "VITAMINB12", "FOLATE", "FERRITIN", "TIBC", "IRON", "RETICCTPCT" in the last 72 hours. Sepsis Labs: No results for input(s): "PROCALCITON", "LATICACIDVEN" in the last 168 hours.  Recent Results (from the past 240 hour(s))  MRSA Next Gen by PCR, Nasal     Status: None   Collection Time: 12/04/22  1:46 PM   Specimen: Nasal Mucosa; Nasal Swab  Result Value Ref Range Status   MRSA by PCR Next Gen NOT DETECTED NOT DETECTED Final    Comment: (NOTE) The GeneXpert MRSA Assay (FDA approved for  NASAL specimens only), is one component of a comprehensive MRSA colonization surveillance program. It is not intended to diagnose MRSA infection nor to guide or monitor treatment for MRSA infections. Test performance is not FDA approved in patients less than 85 years old. Performed at [facility], [address] [place], [place] [address]          Radiology Studies: VAS [place] LOWER EXTREMITY VENOUS (DVT) (ONLY [facility] & [facility])  Result Date: 12/04/2022  Lower Venous DVT Study Patient Name:  Mathew Spilman.  Date of Exam:   12/04/2022 Medical Rec #: [id number]               Accession #:    [id number] Date of Birth: April 19, 1956               Patient Gender: M Patient Age:   27 years Exam Location:  [facility] Procedure:      VAS [place] LOWER EXTREMITY VENOUS (DVT) Referring Phys: Gloris Manchester --------------------------------------------------------------------------------  Indications: Pain.  Risk Factors: Suspected PE. Anticoagulation: Heparin. Comparison Study: No prior studies. Performing Technologist: Chanda Busing RVT  Examination Guidelines: A complete evaluation includes B-mode imaging, spectral Doppler, color Doppler, and power Doppler as needed of all accessible portions of each vessel. Bilateral testing is considered an integral part of a complete examination. Limited examinations for reoccurring indications may be performed as noted. The reflux portion of the exam is performed with the patient in reverse Trendelenburg.  +-----+---------------+---------+-----------+----------+--------------+ RIGHTCompressibilityPhasicitySpontaneityPropertiesThrombus Aging +-----+---------------+---------+-----------+----------+--------------+ CFV  Full           Yes      Yes                                 +-----+---------------+---------+-----------+----------+--------------+   +---------+---------------+---------+-----------+----------+--------------+ LEFT      CompressibilityPhasicitySpontaneityPropertiesThrombus Aging +---------+---------------+---------+-----------+----------+--------------+ CFV      Full           Yes      Yes                                 +---------+---------------+---------+-----------+----------+--------------+ SFJ      Full                                                        +---------+---------------+---------+-----------+----------+--------------+ FV Prox  Full                                                        +---------+---------------+---------+-----------+----------+--------------+  FV Mid   Full                                                        +---------+---------------+---------+-----------+----------+--------------+ FV DistalFull                                                        +---------+---------------+---------+-----------+----------+--------------+ PFV      Full                                                        +---------+---------------+---------+-----------+----------+--------------+ POP      Full           Yes      Yes                                 +---------+---------------+---------+-----------+----------+--------------+ PTV      Full                                                        +---------+---------------+---------+-----------+----------+--------------+ PERO     Full                                                        +---------+---------------+---------+-----------+----------+--------------+ Soleal   None                                         Acute          +---------+---------------+---------+-----------+----------+--------------+ Gastroc  None                                         Acute          +---------+---------------+---------+-----------+----------+--------------+ GSV      Full                                         Acute           +---------+---------------+---------+-----------+----------+--------------+ Thrombus located in the GSV is noted to only be in the proximal calf.    Summary: RIGHT: - No evidence of common femoral vein obstruction.  LEFT: - Findings consistent with acute deep vein thrombosis involving the left soleal veins, and left gastrocnemius veins. - Findings consistent with acute superficial vein thrombosis involving the left great saphenous vein. - No cystic structure found in the popliteal  fossa.  *See table(s) above for measurements and observations. Electronically signed by Gerarda Fraction on 12/04/2022 at 5:38:00 PM.    Final    CT Angio Chest PE W and/or Wo Contrast  Result Date: 12/04/2022 CLINICAL DATA:  Right-sided chest pain with shortness of breath. Pulmonary embolism suspected, high probability. EXAM: CT ANGIOGRAPHY CHEST WITH CONTRAST TECHNIQUE: Multidetector CT imaging of the chest was performed using the standard protocol during bolus administration of intravenous contrast. Multiplanar CT image reconstructions and MIPs were obtained to evaluate the vascular anatomy. RADIATION DOSE REDUCTION: This exam was performed according to the departmental dose-optimization program which includes automated exposure control, adjustment of the mA and/or kV according to patient size and/or use of iterative reconstruction technique. CONTRAST:  75mL OMNIPAQUE IOHEXOL 350 MG/ML SOLN COMPARISON:  Chest radiographs same date.  Abdominal CT 07/26/2003. FINDINGS: Cardiovascular: The pulmonary arteries are well opacified with contrast to the level of the subsegmental branches. There are multiple partially obstructing pulmonary emboli, primarily within the segmental and subsegmental branches of the right lower lobe. There is lesser involvement of the right upper lobe and lingula. No more proximal pulmonary emboli are demonstrated. Mild atherosclerosis of the aorta, great vessels and coronary arteries without acute systemic arterial  abnormalities. The heart size is normal. There is no pericardial effusion. Mediastinum/Nodes: There are no enlarged mediastinal, hilar or axillary lymph nodes. The thyroid gland, trachea and esophagus demonstrate no significant findings. Lungs/Pleura: Small right pleural effusion with dependent right lower lobe airspace disease which may reflect atelectasis or an early pulmonary infarct. There is minimal dependent atelectasis in the left lower lobe and lingula. No suspicious pulmonary nodularity. Upper abdomen: Images through the upper abdomen demonstrate numerous nonobstructing bilateral renal calculi. There are bilateral renal cysts for which no follow-up imaging is recommended. Musculoskeletal/Chest wall: There is no chest wall mass or suspicious osseous finding. Mild spondylosis. Review of the MIP images confirms the above findings. IMPRESSION: 1. Multiple partially obstructing pulmonary emboli bilaterally, primarily within the segmental and subsegmental branches of the right lower lobe. 2. Small right pleural effusion with dependent right lower lobe airspace disease which may reflect atelectasis or an early pulmonary infarct. 3. No evidence of right heart strain. 4. Bilateral nephrolithiasis. 5.  Aortic Atherosclerosis (ICD10-I70.0). 6. Critical Value/emergent results were called by telephone at the time of interpretation on 12/04/2022 at 11:45 am to provider [facility] , who verbally acknowledged these results. Electronically Signed   By: Carey Bullocks M.D.   On: 12/04/2022 11:48   DG Chest Port 1 View  Result Date: 12/04/2022 CLINICAL DATA:  Chest pain with shortness of breath and nonproductive cough for 2-3 days. EXAM: PORTABLE CHEST 1 VIEW COMPARISON:  No prior chest radiographs.  Abdominal CT 07/26/2003. FINDINGS: 1054 hours. Lordotic positioning. The heart size and mediastinal contours are normal. New patchy right basilar airspace disease and a possible small right pleural effusion. The left lung  appears clear. No pleural effusion or pneumothorax. No osseous abnormalities are demonstrated. Telemetry leads overlie the chest. IMPRESSION: New patchy right basilar airspace disease and possible small right pleural effusion, suspicious for early pneumonia. Electronically Signed   By: Carey Bullocks M.D.   On: 12/04/2022 11:21        Scheduled Meds:  apixaban  10 mg Oral BID   Followed by   Melene Muller ON 12/12/2022] apixaban  5 mg Oral BID   aspirin EC  81 mg Oral Daily   atorvastatin  10 mg Oral Daily   benazepril  20 mg  Oral Daily   Chlorhexidine Gluconate Cloth  6 each Topical Daily   diltiazem  30 mg Oral Q6H   Continuous Infusions:   LOS: 1 day    Time spent: 53 minutes spent on chart review, discussion with nursing staff, consultants, updating family and interview/physical exam; more than 50% of that time was spent in counseling and/or coordination of care.    Alvira Philips [place], DO [facility] Available via Epic secure chat 7am-7pm After these hours, please refer to coverage provider listed on [email or web address] 12/05/2022, 10:37 AM

## 2022-12-05 NOTE — Progress Notes (Signed)
ANTICOAGULATION CONSULT NOTE  Pharmacy Consult for : transition IV heparin to apixaban Indication: Afib, PE  No Active Allergies  Patient Measurements: Height: 6\' 3"  (190.5 cm) Weight: 93 kg (205 lb 0.4 oz) IBW/kg (Calculated) : 84.5  Vital Signs: Temp: 97.8 F (36.6 C) (05/23 0805) Temp Source: Oral (05/23 0805) BP: 126/67 (05/23 0805) Pulse Rate: 71 (05/23 0805)  Labs: Recent Labs    12/04/22 1003 12/04/22 1026 12/04/22 1419 12/04/22 1753 12/05/22 0051  HGB 14.1 14.6  --   --  14.1  HCT 42.5 43.0  --   --  42.2  PLT 228  --   --   --  226  LABPROT 14.9  --   --   --   --   INR 1.1  --   --   --   --   HEPARINUNFRC  --   --   --  0.46 0.49  CREATININE 1.40* 1.40*  --   --  1.31*  TROPONINIHS 6  --  4  --   --      Estimated Creatinine Clearance: 66.3 mL/min (A) (by C-G formula based on SCr of 1.31 mg/dL (H)).   Medical History: Past Medical History:  Diagnosis Date   ACNE NEC 07/29/2008   ALLERGIC RHINITIS 07/29/2008   Chronic kidney disease    kidney stones   COLONIC POLYPS, HX OF 07/29/2008   ERECTILE DYSFUNCTION 07/29/2008   HYPERLIPIDEMIA 07/29/2008   HYPERTENSION 07/29/2008   NEPHROLITHIASIS, HX OF 07/29/2008   PSA, INCREASED 07/29/2008    Medications:  Medications Prior to Admission  Medication Sig Dispense Refill Last Dose   amLODipine-benazepril (LOTREL) 5-20 MG capsule Take 1 capsule by mouth daily. 90 capsule 3 12/03/2022   aspirin 81 MG EC tablet Take 81 mg by mouth daily.   12/03/2022   atorvastatin (LIPITOR) 10 MG tablet Take 1 tablet (10 mg total) by mouth daily. 90 tablet 3 12/03/2022   doxycycline (VIBRA-TABS) 100 MG tablet Take 1 tablet (100 mg total) by mouth 2 (two) times daily. (Patient not taking: Reported on 12/04/2022) 20 tablet 0 Completed Course   Scheduled:   apixaban  10 mg Oral BID   Followed by   Melene Muller ON 12/12/2022] apixaban  5 mg Oral BID   aspirin EC  81 mg Oral Daily   atorvastatin  10 mg Oral Daily   benazepril  20 mg Oral  Daily   Chlorhexidine Gluconate Cloth  6 each Topical Daily   diltiazem  30 mg Oral Q6H     Assessment: 49 yoM with PMH HTN, CKD, no known cardiac Hx, presenting with chest pain, DOE, presyncope; found to be in Afib/RVR. CTA chest also reveals multiple partially obstructing bilateral pulmonary emboli at segmental/subsegmental level; no RH strain. Pharmacy consulted to dose IV heparin on 5/22.  Today, Pharmacy consulted to transition off IV heparin and begin apixaban.  Significant events:  Today, 12/05/2022: Heparin level 0.49 - therapeutic on heparin infusion at 1600 units/hr CBC: WNL SCr 1.3  No bleeding or infusion issues noted  Goal of Therapy: Treat PE Heparin level 0.3-0.7 units/ml Monitor platelets by anticoagulation protocol: Yes  Plan: Stop IV heparin and associated labs At time when IV heparin stopped, begin apixaban 10 mg twice daily for 7 days followed by 5 mg twice daily Pharmacy will sign off consult but continue to monitor CBC & signs/symptoms of bleeding  Thank you for allowing pharmacy to be a part of this patient's care.  Selinda Eon, PharmD,  BCPS Clinical Pharmacist [place] Please utilize Amion for appropriate phone number to reach the unit pharmacist [facility]) 12/05/2022 8:40 AM

## 2022-12-06 ENCOUNTER — Other Ambulatory Visit (HOSPITAL_COMMUNITY): Payer: Self-pay

## 2022-12-06 DIAGNOSIS — I4891 Unspecified atrial fibrillation: Secondary | ICD-10-CM | POA: Diagnosis not present

## 2022-12-06 LAB — CBC
HCT: 40.8 % (ref 39.0–52.0)
Hemoglobin: 13.5 g/dL (ref 13.0–17.0)
MCH: 32 pg (ref 26.0–34.0)
MCHC: 33.1 g/dL (ref 30.0–36.0)
MCV: 96.7 fL (ref 80.0–100.0)
Platelets: 260 10*3/uL (ref 150–400)
RBC: 4.22 MIL/uL (ref 4.22–5.81)
RDW: 12 % (ref 11.5–15.5)
WBC: 7.2 10*3/uL (ref 4.0–10.5)
nRBC: 0 % (ref 0.0–0.2)

## 2022-12-06 MED ORDER — APIXABAN 5 MG PO TABS: 5.0000 mg | ORAL_TABLET | Freq: Two times a day (BID) | ORAL | 0 refills | Status: DC

## 2022-12-06 MED ORDER — APIXABAN 5 MG PO TABS: ORAL_TABLET | ORAL | 0 refills | Status: DC

## 2022-12-06 MED ORDER — APIXABAN (ELIQUIS) VTE STARTER PACK (10MG AND 5MG)
ORAL_TABLET | ORAL | 0 refills | Status: DC
  Filled 2022-12-06: qty 74, fill #0

## 2022-12-06 MED ORDER — DILTIAZEM HCL ER COATED BEADS 120 MG PO CP24
120.0000 mg | ORAL_CAPSULE | Freq: Every day | ORAL | Status: DC
  Administered 2022-12-06: 120 mg via ORAL
  Filled 2022-12-06: qty 1

## 2022-12-06 MED ORDER — APIXABAN (ELIQUIS) VTE STARTER PACK (10MG AND 5MG): ORAL_TABLET | ORAL | 0 refills | Status: DC

## 2022-12-06 MED ORDER — APIXABAN (ELIQUIS) VTE STARTER PACK (10MG AND 5MG)
ORAL_TABLET | ORAL | 0 refills | Status: DC
  Filled 2022-12-06: qty 74, 28d supply, fill #0

## 2022-12-06 MED ORDER — BENAZEPRIL HCL 20 MG PO TABS: 20.0000 mg | ORAL_TABLET | Freq: Every day | ORAL | 0 refills | Status: DC

## 2022-12-06 MED ORDER — DILTIAZEM HCL ER COATED BEADS 120 MG PO CP24: 120.0000 mg | ORAL_CAPSULE | Freq: Every day | ORAL | 0 refills | Status: DC

## 2022-12-06 NOTE — Progress Notes (Signed)
Patient stated concern with prescription for Eliquis. [employer] contacted, pharmacist Nehemiah Settle) stated that prior authorization would be needed and prescription to be re written for started pack. Additionally, [facility] did not have medication available until next Tuesday. Provider contacted to assist, medication sent to [facility] pharmacy.

## 2022-12-06 NOTE — Progress Notes (Signed)
Patient is alert, oriented x4, ambulatory without assistance. No change from am assessment. Pt is stable. Discharge instructions were reviewed with pt and spouse. Questions, cocnerns were denied at this time.

## 2022-12-06 NOTE — Discharge Summary (Signed)
Physician Discharge Summary  Mathew Sanchez. ZOX:[id number] DOB: [birth year] DOA: 12/04/2022  PCP: Corwin Levins, MD  Admit date: 12/04/2022 Discharge date: 12/06/2022  Admitted From: Home  Disposition: Home  Recommendations for Outpatient Follow-up:  Follow up with PCP in 1-2 weeks Referral placed to cardiology/afib clinic for new onset Started on Cardizem 120 mg p.o. daily for rate control, Eliquis for anticoagulation Discontinued home amlodipine in favor now on Cardizem Discontinued aspirin as now on Eliquis  Home Health: No Equipment/Devices: None  Discharge Condition: Stable CODE STATUS: Full code Diet recommendation: Heart healthy diet  History of present illness:  Mathew Juras. is a 67 y.o. male with past medical history significant for HTN, HLD, CKD stage IIIa who presented to [facility] ED on 5/22 with complaint of right-sided abdominal pain, shortness of breath exacerbated while lying flat.  Patient reports usual state of health until 3-4 days prior to admission when he noticed some pain in his left upper inner thigh.  Patient reports pain subsided after less than a day in which she started having some left-sided pleuritic chest pain over the last 48 hours.  Pain improved when sitting upright but worse with inspiration and lying on his back.  Patient had telehealth visit with urgent care and was advised to seek further evaluation in the ED.   In the ED, temperature 97.9 F, HR 154, RR 18, BP 145/81, SpO2 96% on room air.  WBC 9.3, hemoglobin 14.1, platelets 228.  Sodium 137, potassium 4.0, chloride 105, CO2 23, glucose 130, BUN 19, creatinine 1.40.  AST 15, ALT 14, total bilirubin 1.4.  High sensitive troponin 6 followed by 4.  BNP 38.3.  TSH 1.381.  INR 1.1.  Urinalysis unrevealing.  Chest x-ray with new patchy right basilar airspace disease and possible small right pleural effusion.  CT angiogram chest with multiple partially obstructing pulmonary emboli  bilaterally prominently within the segmental and subsegmental branches of the right lower lobe, small right pleural effusion with dependent right lower lobe airspace disease consistent with atelectasis or early pulmonary infarct, no evidence of right heart strain.  Patient was started on a Cardizem drip for A-fib with RVR, heparin drip for acute PE.  TRH consulted for admission for further evaluation management of new onset A-fib with RVR and pulmonary embolism.  Hospital course:  Atrial fibrillation with RVR, new onset On arrival to the ED, patient was noted to be tachycardic with EKG/telemetry confirming A-fib with RVR.  Patient initially was started on a Cardizem and heparin drip with improved heart rate control.  Patient denies any previous history of A-fib.  TSH within normal limits.  TTE with LVEF 55-60%, no LV regional wall motion normalities, LV diastolic parameters normal, mild MR, no aortic stenosis, IVC normal in size.  Patient was initially started on a Cardizem drip and transition to 120 mg CD daily.  Additionally patient was initially started on heparin drip and now transition to Eliquis.  During hospitalization patient's atrial fibrillation converted back to normal sinus rhythm.  Referral to outpatient cardiology for further management.  May need to consider cardiac monitoring outpatient to follow-up for recurrence of atrial fibrillation as this may be an isolated event secondary to acute pulmonary embolism.   Pulmonary embolism Left lower extremity DVT CT angiogram chest with multiple partially obstructing pulmonary emboli bilaterally prominently within the segmental and subsegmental branches of the right lower lobe, small right pleural effusion with dependent right lower lobe airspace disease consistent with atelectasis or  early pulmonary infarct, no evidence of right heart strain.  Vascular duplex left lower extremity bilateral lower extremities with acute DVT left soleal and left  gastrocnemius veins, acute superficial vein thrombosis left great saphenous vein.  Patient was initially started on heparin drip, now transition to Eliquis.  Outpatient follow-up with PCP, cardiology.   Essential hypertension Discontinued home amlodipine as transitioning to Cardizem CD 120mg  PO daily. Continue benazepril 20 mg p.o. daily.  Continue statin.  Discontinued aspirin now on Eliquis as above.   Hyperlipidemia Atorvastatin 10 mg p.o. daily   CKD stage IIIa Baseline creatinine 1.4, stable.  Discharge Diagnoses:  Principal Problem:   New onset a-fib (HCC) Active Problems:   HLD (hyperlipidemia)   Essential hypertension   CKD (chronic kidney disease) stage 3, GFR 30-59 ml/min (HCC)   VTE (venous thromboembolism)    Discharge Instructions  Discharge Instructions     Amb referral to AFIB Clinic   Complete by: As directed    Ambulatory referral to Cardiology   Complete by: As directed    Call MD for:  difficulty breathing, headache or visual disturbances   Complete by: As directed    Call MD for:  persistant dizziness or light-headedness   Complete by: As directed    Call MD for:  persistant nausea and vomiting   Complete by: As directed    Call MD for:  severe uncontrolled pain   Complete by: As directed    Call MD for:  temperature >100.4   Complete by: As directed    Diet - low sodium heart healthy   Complete by: As directed    Increase activity slowly   Complete by: As directed       Allergies as of 12/06/2022   No Active Allergies      Medication List     STOP taking these medications    amLODipine-benazepril 5-20 MG capsule Commonly known as: LOTREL   aspirin EC 81 MG tablet   doxycycline 100 MG tablet Commonly known as: VIBRA-TABS       TAKE these medications    apixaban 5 MG Tabs tablet Commonly known as: Eliquis Take 2 tablets (10mg ) twice daily for 7 days, then 1 tablet (5mg ) twice daily   apixaban 5 MG Tabs tablet Commonly known  as: ELIQUIS Take 1 tablet (5 mg total) by mouth 2 (two) times daily. Start taking on: January 05, 2023   atorvastatin 10 MG tablet Commonly known as: LIPITOR Take 1 tablet (10 mg total) by mouth daily.   benazepril 20 MG tablet Commonly known as: LOTENSIN Take 1 tablet (20 mg total) by mouth daily.   benazepril 20 MG tablet Commonly known as: LOTENSIN Take 1 tablet (20 mg total) by mouth daily. Start taking on: January 05, 2023   diltiazem 120 MG 24 hr capsule Commonly known as: CARDIZEM CD Take 1 capsule (120 mg total) by mouth daily.   diltiazem 120 MG 24 hr capsule Commonly known as: Cardizem CD Take 1 capsule (120 mg total) by mouth daily. Start taking on: January 05, 2023        Follow-up Information     Corwin Levins, MD. Schedule an appointment as soon as possible for a visit in 1 week(s).   Specialties: Internal Medicine, Radiology Contact information: [address] [place] [address] [phone]         [facility]. Schedule an appointment as soon as possible for a visit.   Contact information: [address]  [place] [address] [phone]               No Active Allergies  Consultations: None   Procedures/Studies: ECHOCARDIOGRAM COMPLETE  Result Date: 12/05/2022    ECHOCARDIOGRAM REPORT   Patient Name:   Mathew Dylla. Date of Exam: 12/05/2022 Medical Rec #:  [id number]              Height:       75.0 in Accession #:    [id number]             Weight:       205.0 lb Date of Birth:  1955-10-01              BSA:          2.218 m Patient Age:    67 years               BP:           125/67 mmHg Patient Gender: M                      HR:           74 bpm. Exam Location:  Inpatient Procedure: 2D Echo, Color Doppler and Cardiac Doppler Indications:    Atrial fibrillation  History:        Patient has no prior history of Echocardiogram examinations.                 Risk Factors:Hypertension and Dyslipidemia. CKD.   Sonographer:    Milda Smart Referring Phys: [id number] MIR M [facility]  Sonographer Comments: Image acquisition challenging due to respiratory motion. IMPRESSIONS  1. Left ventricular ejection fraction, by estimation, is 55 to 60%. The left ventricle has normal function. The left ventricle has no regional wall motion abnormalities. Left ventricular diastolic parameters were normal.  2. Right ventricular systolic function is normal. The right ventricular size is normal.  3. The mitral valve is normal in structure. Mild mitral valve regurgitation. No evidence of mitral stenosis.  4. The aortic valve is normal in structure. Aortic valve regurgitation is not visualized. No aortic stenosis is present.  5. The inferior vena cava is normal in size with greater than 50% respiratory variability, suggesting right atrial pressure of 3 mmHg. FINDINGS  Left Ventricle: Left ventricular ejection fraction, by estimation, is 55 to 60%. The left ventricle has normal function. The left ventricle has no regional wall motion abnormalities. The left ventricular internal cavity size was normal in size. There is  no left ventricular hypertrophy. Left ventricular diastolic parameters were normal. Right Ventricle: The right ventricular size is normal. No increase in right ventricular wall thickness. Right ventricular systolic function is normal. Left Atrium: Left atrial size was normal in size. Right Atrium: Right atrial size was normal in size. Pericardium: There is no evidence of pericardial effusion. Mitral Valve: The mitral valve is normal in structure. Mild mitral valve regurgitation. No evidence of mitral valve stenosis. Tricuspid Valve: The tricuspid valve is normal in structure. Tricuspid valve regurgitation is not demonstrated. No evidence of tricuspid stenosis. Aortic Valve: The aortic valve is normal in structure. Aortic valve regurgitation is not visualized. No aortic stenosis is present. Pulmonic Valve: The pulmonic valve was  normal in structure. Pulmonic valve regurgitation is not visualized. No evidence of pulmonic stenosis. Aorta: The aortic root is normal in size and structure. Venous: The inferior vena cava is normal in size with greater than 50% respiratory  variability, suggesting right atrial pressure of 3 mmHg. IAS/Shunts: No atrial level shunt detected by color flow Doppler.  LEFT VENTRICLE PLAX 2D LVIDd:         4.70 cm      Diastology LVIDs:         3.00 cm      LV e' medial:    7.07 cm/s LV PW:         1.10 cm      LV E/e' medial:  8.0 LV IVS:        0.80 cm      LV e' lateral:   7.18 cm/s LVOT diam:     2.60 cm      LV E/e' lateral: 7.9 LV SV:         103 LV SV Index:   46 LVOT Area:     5.31 cm  LV Volumes (MOD) LV vol d, MOD A2C: 78.0 ml LV vol d, MOD A4C: 105.0 ml LV vol s, MOD A2C: 27.8 ml LV vol s, MOD A4C: 45.2 ml LV SV MOD A2C:     50.2 ml LV SV MOD A4C:     105.0 ml LV SV MOD BP:      55.7 ml RIGHT VENTRICLE             IVC RV S prime:     10.40 cm/s  IVC diam: 1.30 cm TAPSE (M-mode): 2.2 cm LEFT ATRIUM             Index        RIGHT ATRIUM           Index LA diam:        3.90 cm 1.76 cm/m   RA Area:     14.30 cm LA Vol (A2C):   40.0 ml 18.04 ml/m  RA Volume:   35.70 ml  16.10 ml/m LA Vol (A4C):   38.9 ml 17.54 ml/m LA Biplane Vol: 39.5 ml 17.81 ml/m  AORTIC VALVE LVOT Vmax:   95.30 cm/s LVOT Vmean:  70.100 cm/s LVOT VTI:    0.194 m  AORTA Ao Root diam: 3.40 cm Ao Asc diam:  3.50 cm MITRAL VALVE               TRICUSPID VALVE MV Area (PHT): 3.50 cm    TR Peak grad:   9.1 mmHg MV Decel Time: 217 msec    TR Vmax:        151.00 cm/s MR Peak grad: 14.9 mmHg MR Vmax:      193.00 cm/s  SHUNTS MV E velocity: 56.60 cm/s  Systemic VTI:  0.19 m MV A velocity: 53.60 cm/s  Systemic Diam: 2.60 cm MV E/A ratio:  1.06 Mathew Sanchez Electronically signed by Dorthula Nettles Signature Date/Time: 12/05/2022/4:12:55 PM    Final    VAS [place] LOWER EXTREMITY VENOUS (DVT) (ONLY [facility] & [facility])  Result Date: 12/04/2022  Lower Venous DVT  Study Patient Name:  Mathew Braham.  Date of Exam:   12/04/2022 Medical Rec #: [id number]               Accession #:    [id number] Date of Birth: 07-30-1955               Patient Gender: M Patient Age:   31 years Exam Location:  [facility] Procedure:      VAS [place] LOWER EXTREMITY VENOUS (DVT) Referring Phys: Gloris Manchester --------------------------------------------------------------------------------  Indications: Pain.  Risk Factors:  Suspected PE. Anticoagulation: Heparin. Comparison Study: No prior studies. Performing Technologist: Chanda Busing RVT  Examination Guidelines: A complete evaluation includes B-mode imaging, spectral Doppler, color Doppler, and power Doppler as needed of all accessible portions of each vessel. Bilateral testing is considered an integral part of a complete examination. Limited examinations for reoccurring indications may be performed as noted. The reflux portion of the exam is performed with the patient in reverse Trendelenburg.  +-----+---------------+---------+-----------+----------+--------------+ RIGHTCompressibilityPhasicitySpontaneityPropertiesThrombus Aging +-----+---------------+---------+-----------+----------+--------------+ CFV  Full           Yes      Yes                                 +-----+---------------+---------+-----------+----------+--------------+   +---------+---------------+---------+-----------+----------+--------------+ LEFT     CompressibilityPhasicitySpontaneityPropertiesThrombus Aging +---------+---------------+---------+-----------+----------+--------------+ CFV      Full           Yes      Yes                                 +---------+---------------+---------+-----------+----------+--------------+ SFJ      Full                                                        +---------+---------------+---------+-----------+----------+--------------+ FV Prox  Full                                                         +---------+---------------+---------+-----------+----------+--------------+ FV Mid   Full                                                        +---------+---------------+---------+-----------+----------+--------------+ FV DistalFull                                                        +---------+---------------+---------+-----------+----------+--------------+ PFV      Full                                                        +---------+---------------+---------+-----------+----------+--------------+ POP      Full           Yes      Yes                                 +---------+---------------+---------+-----------+----------+--------------+ PTV      Full                                                        +---------+---------------+---------+-----------+----------+--------------+  PERO     Full                                                        +---------+---------------+---------+-----------+----------+--------------+ Soleal   None                                         Acute          +---------+---------------+---------+-----------+----------+--------------+ Gastroc  None                                         Acute          +---------+---------------+---------+-----------+----------+--------------+ GSV      Full                                         Acute          +---------+---------------+---------+-----------+----------+--------------+ Thrombus located in the GSV is noted to only be in the proximal calf.    Summary: RIGHT: - No evidence of common femoral vein obstruction.  LEFT: - Findings consistent with acute deep vein thrombosis involving the left soleal veins, and left gastrocnemius veins. - Findings consistent with acute superficial vein thrombosis involving the left great saphenous vein. - No cystic structure found in the popliteal fossa.  *See table(s) above for measurements and observations. Electronically signed by  Gerarda Fraction on 12/04/2022 at 5:38:00 PM.    Final    CT Angio Chest PE W and/or Wo Contrast  Result Date: 12/04/2022 CLINICAL DATA:  Right-sided chest pain with shortness of breath. Pulmonary embolism suspected, high probability. EXAM: CT ANGIOGRAPHY CHEST WITH CONTRAST TECHNIQUE: Multidetector CT imaging of the chest was performed using the standard protocol during bolus administration of intravenous contrast. Multiplanar CT image reconstructions and MIPs were obtained to evaluate the vascular anatomy. RADIATION DOSE REDUCTION: This exam was performed according to the departmental dose-optimization program which includes automated exposure control, adjustment of the mA and/or kV according to patient size and/or use of iterative reconstruction technique. CONTRAST:  75mL OMNIPAQUE IOHEXOL 350 MG/ML SOLN COMPARISON:  Chest radiographs same date.  Abdominal CT 07/26/2003. FINDINGS: Cardiovascular: The pulmonary arteries are well opacified with contrast to the level of the subsegmental branches. There are multiple partially obstructing pulmonary emboli, primarily within the segmental and subsegmental branches of the right lower lobe. There is lesser involvement of the right upper lobe and lingula. No more proximal pulmonary emboli are demonstrated. Mild atherosclerosis of the aorta, great vessels and coronary arteries without acute systemic arterial abnormalities. The heart size is normal. There is no pericardial effusion. Mediastinum/Nodes: There are no enlarged mediastinal, hilar or axillary lymph nodes. The thyroid gland, trachea and esophagus demonstrate no significant findings. Lungs/Pleura: Small right pleural effusion with dependent right lower lobe airspace disease which may reflect atelectasis or an early pulmonary infarct. There is minimal dependent atelectasis in the left lower lobe and lingula. No suspicious pulmonary nodularity. Upper abdomen: Images through the upper abdomen demonstrate numerous  nonobstructing bilateral renal calculi. There are bilateral renal cysts for which no  follow-up imaging is recommended. Musculoskeletal/Chest wall: There is no chest wall mass or suspicious osseous finding. Mild spondylosis. Review of the MIP images confirms the above findings. IMPRESSION: 1. Multiple partially obstructing pulmonary emboli bilaterally, primarily within the segmental and subsegmental branches of the right lower lobe. 2. Small right pleural effusion with dependent right lower lobe airspace disease which may reflect atelectasis or an early pulmonary infarct. 3. No evidence of right heart strain. 4. Bilateral nephrolithiasis. 5.  Aortic Atherosclerosis (ICD10-I70.0). 6. Critical Value/emergent results were called by telephone at the time of interpretation on 12/04/2022 at 11:45 am to provider [facility] , who verbally acknowledged these results. Electronically Signed   By: Carey Bullocks M.D.   On: 12/04/2022 11:48   DG Chest Port 1 View  Result Date: 12/04/2022 CLINICAL DATA:  Chest pain with shortness of breath and nonproductive cough for 2-3 days. EXAM: PORTABLE CHEST 1 VIEW COMPARISON:  No prior chest radiographs.  Abdominal CT 07/26/2003. FINDINGS: 1054 hours. Lordotic positioning. The heart size and mediastinal contours are normal. New patchy right basilar airspace disease and a possible small right pleural effusion. The left lung appears clear. No pleural effusion or pneumothorax. No osseous abnormalities are demonstrated. Telemetry leads overlie the chest. IMPRESSION: New patchy right basilar airspace disease and possible small right pleural effusion, suspicious for early pneumonia. Electronically Signed   By: Carey Bullocks M.D.   On: 12/04/2022 11:21     Subjective: Patient seen examined bedside, resting comfortably.  Lying in bed.  No specific complaints.  Remains converted from A-fib to normal sinus rhythm on telemetry this morning.  Transitioned to long-acting Cardizem with adequate  control of heart rate.  Ready for discharge home.  No other specific questions or concerns at this time.  Denies headache, no dizziness, no chest pain, no palpitations, no shortness of breath, no fever/chills/night sweats, no nausea/vomiting/diarrhea, no focal weakness, no fatigue, no paresthesias.  No acute events overnight per nursing staff.  Discharge Exam: Vitals:   12/06/22 0309 12/06/22 0615  BP: 105/66 116/76  Pulse: 66 72  Resp: 20 16  Temp: 98.3 F (36.8 C) 98.2 F (36.8 C)  SpO2: 92% 92%   Vitals:   12/05/22 1839 12/05/22 2259 12/06/22 0309 12/06/22 0615  BP: 110/80 (!) 106/56 105/66 116/76  Pulse: 79 69 66 72  Resp: 18 18 20 16   Temp: 98.1 F (36.7 C) 98.2 F (36.8 C) 98.3 F (36.8 C) 98.2 F (36.8 C)  TempSrc: Oral Oral Oral Oral  SpO2: 92% 92% 92% 92%  Weight:      Height:        Physical Exam: GEN: NAD, alert and oriented x 3, wd/wn HEENT: NCAT, PERRL, EOMI, sclera clear, MMM PULM: CTAB w/o wheezes/crackles, normal respiratory effort, on room air CV: RRR w/o M/G/R GI: abd soft, NTND, NABS, no R/G/M MSK: no peripheral edema, muscle strength globally intact 5/5 bilateral upper/lower extremities NEURO: CN II-XII intact, no focal deficits, sensation to light touch intact PSYCH: normal mood/affect Integumentary: dry/intact, no rashes or wounds    The results of significant diagnostics from this hospitalization (including imaging, microbiology, ancillary and laboratory) are listed below for reference.     Microbiology: Recent Results (from the past 240 hour(s))  MRSA Next Gen by PCR, Nasal     Status: None   Collection Time: 12/04/22  1:46 PM   Specimen: Nasal Mucosa; Nasal Swab  Result Value Ref Range Status   MRSA by PCR Next Gen NOT DETECTED NOT DETECTED Final  Comment: (NOTE) The GeneXpert MRSA Assay (FDA approved for NASAL specimens only), is one component of a comprehensive MRSA colonization surveillance program. It is not intended to diagnose  MRSA infection nor to guide or monitor treatment for MRSA infections. Test performance is not FDA approved in patients less than 2 years old. Performed at [facility], [address], [place], [place] [address]      Labs: BNP (last 3 results) Recent Labs    12/04/22 1003  BNP 38.3   Basic Metabolic Panel: Recent Labs  Lab 12/04/22 1003 12/04/22 1026 12/05/22 0051  NA 137 140 139  K 4.0 4.1 3.9  CL 105 105 107  CO2 23  --  23  GLUCOSE 130* 128* 108*  BUN 19 18 22   CREATININE 1.40* 1.40* 1.31*  CALCIUM 8.6*  --  8.3*  MG 2.1  --   --    Liver Function Tests: Recent Labs  Lab 12/04/22 1003 12/05/22 0051  AST 15 13*  ALT 14 13  ALKPHOS 66 62  BILITOT 1.6* 0.8  PROT 7.2 6.9  ALBUMIN 3.6 3.3*   No results for input(s): "LIPASE", "AMYLASE" in the last 168 hours. No results for input(s): "AMMONIA" in the last 168 hours. CBC: Recent Labs  Lab 12/04/22 1003 12/04/22 1026 12/05/22 0051 12/06/22 0504  WBC 9.3  --  9.4 7.2  HGB 14.1 14.6 14.1 13.5  HCT 42.5 43.0 42.2 40.8  MCV 95.1  --  95.3 96.7  PLT 228  --  226 260   Cardiac Enzymes: No results for input(s): "CKTOTAL", "CKMB", "CKMBINDEX", "TROPONINI" in the last 168 hours. BNP: Invalid input(s): "POCBNP" CBG: No results for input(s): "GLUCAP" in the last 168 hours. D-Dimer No results for input(s): "DDIMER" in the last 72 hours. Hgb A1c No results for input(s): "HGBA1C" in the last 72 hours. Lipid Profile No results for input(s): "CHOL", "HDL", "LDLCALC", "TRIG", "CHOLHDL", "LDLDIRECT" in the last 72 hours. Thyroid function studies Recent Labs    12/04/22 1003  TSH 1.381   Anemia work up No results for input(s): "VITAMINB12", "FOLATE", "FERRITIN", "TIBC", "IRON", "RETICCTPCT" in the last 72 hours. Urinalysis    Component Value Date/Time   COLORURINE YELLOW 04/25/2022 0735   APPEARANCEUR CLEAR 04/25/2022 0735   LABSPEC 1.015 04/25/2022 0735   PHURINE 7.0 04/25/2022 0735    GLUCOSEU NEGATIVE 04/25/2022 0735   HGBUR NEGATIVE 04/25/2022 0735   BILIRUBINUR NEGATIVE 04/25/2022 0735   KETONESUR NEGATIVE 04/25/2022 0735   UROBILINOGEN 1.0 04/25/2022 0735   NITRITE NEGATIVE 04/25/2022 0735   LEUKOCYTESUR NEGATIVE 04/25/2022 0735   Sepsis Labs Recent Labs  Lab 12/04/22 1003 12/05/22 0051 12/06/22 0504  WBC 9.3 9.4 7.2   Microbiology Recent Results (from the past 240 hour(s))  MRSA Next Gen by PCR, Nasal     Status: None   Collection Time: 12/04/22  1:46 PM   Specimen: Nasal Mucosa; Nasal Swab  Result Value Ref Range Status   MRSA by PCR Next Gen NOT DETECTED NOT DETECTED Final    Comment: (NOTE) The GeneXpert MRSA Assay (FDA approved for NASAL specimens only), is one component of a comprehensive MRSA colonization surveillance program. It is not intended to diagnose MRSA infection nor to guide or monitor treatment for MRSA infections. Test performance is not FDA approved in patients less than 73 years old. Performed at [facility], [address] [place], [place] [address]      Time coordinating discharge: Over 30 minutes  SIGNED:  Alvira Philips [place], DO  [facility] 12/06/2022, 9:10 AM

## 2022-12-06 NOTE — Progress Notes (Signed)
  Transition of Care [facility]) Screening Note   Patient Details  Name: Mathew Sanchez. Date of Birth: 1955-12-03   Transition of Care [facility]) CM/SW Contact:    Lanier Clam, RN Phone Number: 12/06/2022, 10:40 AM    Transition of Care Department [facility]) has reviewed patient and no TOC needs have been identified at this time. We will continue to monitor patient advancement through interdisciplinary progression rounds. If new patient transition needs arise, please place a TOC consult.

## 2022-12-06 NOTE — Plan of Care (Signed)

## 2022-12-20 ENCOUNTER — Encounter (HOSPITAL_COMMUNITY): Payer: Self-pay | Admitting: Physician Assistant

## 2022-12-20 ENCOUNTER — Inpatient Hospital Stay (HOSPITAL_COMMUNITY)
Admission: RE | Admit: 2022-12-20 | Discharge: 2022-12-20 | Disposition: A | Discharge status: Home / Self Care | Payer: BC Managed Care – PPO | Source: Ambulatory Visit | Attending: Physician Assistant | Admitting: Physician Assistant

## 2022-12-20 ENCOUNTER — Ambulatory Visit (HOSPITAL_COMMUNITY)
Admission: RE | Admit: 2022-12-20 | Discharge: 2022-12-20 | Disposition: A | Discharge status: Home / Self Care | Payer: BC Managed Care – PPO | Source: Ambulatory Visit | Attending: Physician Assistant | Admitting: Physician Assistant

## 2022-12-20 VITALS — BP 138/82 | HR 53 | Ht 75.0 in | Wt 208.8 lb

## 2022-12-20 DIAGNOSIS — I129 Hypertensive chronic kidney disease with stage 1 through stage 4 chronic kidney disease, or unspecified chronic kidney disease: Secondary | ICD-10-CM | POA: Diagnosis not present

## 2022-12-20 DIAGNOSIS — R0683 Snoring: Secondary | ICD-10-CM | POA: Insufficient documentation

## 2022-12-20 DIAGNOSIS — E785 Hyperlipidemia, unspecified: Secondary | ICD-10-CM | POA: Diagnosis not present

## 2022-12-20 DIAGNOSIS — I480 Paroxysmal atrial fibrillation: Secondary | ICD-10-CM | POA: Diagnosis not present

## 2022-12-20 DIAGNOSIS — Z86711 Personal history of pulmonary embolism: Secondary | ICD-10-CM | POA: Insufficient documentation

## 2022-12-20 DIAGNOSIS — Z79899 Other long term (current) drug therapy: Secondary | ICD-10-CM | POA: Insufficient documentation

## 2022-12-20 DIAGNOSIS — Z86718 Personal history of other venous thrombosis and embolism: Secondary | ICD-10-CM | POA: Diagnosis not present

## 2022-12-20 DIAGNOSIS — N189 Chronic kidney disease, unspecified: Secondary | ICD-10-CM | POA: Diagnosis not present

## 2022-12-20 DIAGNOSIS — Z7901 Long term (current) use of anticoagulants: Secondary | ICD-10-CM | POA: Insufficient documentation

## 2022-12-20 DIAGNOSIS — D6869 Other thrombophilia: Secondary | ICD-10-CM | POA: Diagnosis not present

## 2022-12-20 NOTE — Progress Notes (Signed)
Primary Care Physician: Corwin Levins, MD Primary Cardiologist: new to Dr Excell Seltzer Primary Electrophysiologist: none Referring Physician: Dr [place]    Mathew H Dendinger Jr. is a 67 y.o. male with a history of HTN, HLD, CKD, atrial fibrillation who presents for consultation in the [facility]. He presented to [facility] ED on 5/22 with complaint of right-sided abdominal pain, shortness of breath exacerbated while lying flat. Patient had telehealth visit with urgent care and was advised to seek further evaluation in the ED. CT angiogram chest with multiple partially obstructing pulmonary emboli bilaterally prominently within the segmental and subsegmental branches of the right lower lobe. Also had left lower extremity DVT. ECG on admission showed rapid afib, started on diltiazem drip. He spontaneously converted to NSR during his admission. Patient is on Eliquis for a CHADS2VASC score of 2.  Today, patient remains in SR. He does not feel his energy is back to his baseline yet. He did not have any tachypalpitations when in afib, unsure what was the duration of the episode. No bleeding issues on anticoagulation. He does consume alcohol and he does snore. While admitted, he did have nocturnal desaturations.   Today, he denies symptoms of palpitations, chest pain, shortness of breath, orthopnea, PND, lower extremity edema, dizziness, presyncope, syncope, daytime somnolence, bleeding, or neurologic sequela. The patient is tolerating medications without difficulties and is otherwise without complaint today.    Atrial Fibrillation Risk Factors:  he does have symptoms or diagnosis of sleep apnea. he does not have a history of rheumatic fever. he does have a history of alcohol use. The patient does not have a history of early familial atrial fibrillation or other arrhythmias.  he has a BMI of Body mass index is 26.1 kg/m.Marland Kitchen Filed Weights   12/20/22 0834  Weight:  94.7 kg    Family History  Problem Relation Age of Onset   Colon cancer Neg Hx    Rectal cancer Neg Hx    Stomach cancer Neg Hx    Esophageal cancer Neg Hx      Atrial Fibrillation Management history:  Previous antiarrhythmic drugs: none Previous cardioversions: none Previous ablations: none Anticoagulation history: Eliquis   Past Medical History:  Diagnosis Date   ACNE NEC 07/29/2008   ALLERGIC RHINITIS 07/29/2008   Chronic kidney disease    kidney stones   COLONIC POLYPS, HX OF 07/29/2008   ERECTILE DYSFUNCTION 07/29/2008   HYPERLIPIDEMIA 07/29/2008   HYPERTENSION 07/29/2008   NEPHROLITHIASIS, HX OF 07/29/2008   PSA, INCREASED 07/29/2008   Past Surgical History:  Procedure Laterality Date   COLONOSCOPY     POLYPECTOMY     TONSILLECTOMY      Current Outpatient Medications  Medication Sig Dispense Refill   [START ON 01/05/2023] apixaban (ELIQUIS) 5 MG TABS tablet Take 1 tablet (5 mg total) by mouth 2 (two) times daily. 180 tablet 0   APIXABAN (ELIQUIS) VTE STARTER PACK (10MG  AND 5MG ) Take as directed on package: start with two-5mg  tablets twice daily for 7 days. On day 8, switch to one-5mg  tablet twice daily. 74 each 0   atorvastatin (LIPITOR) 10 MG tablet Take 1 tablet (10 mg total) by mouth daily. 90 tablet 3   benazepril (LOTENSIN) 20 MG tablet Take 1 tablet (20 mg total) by mouth daily. 30 tablet 0   [START ON 01/05/2023] benazepril (LOTENSIN) 20 MG tablet Take 1 tablet (20 mg total) by mouth daily. 90 tablet 0   diltiazem (CARDIZEM CD) 120 MG  24 hr capsule Take 1 capsule (120 mg total) by mouth daily. 30 capsule 0   [START ON 01/05/2023] diltiazem (CARDIZEM CD) 120 MG 24 hr capsule Take 1 capsule (120 mg total) by mouth daily. 90 capsule 0   No current facility-administered medications for this encounter.    No Active Allergies  Social History   Socioeconomic History   Marital status: Married    Spouse name: Not on file   Number of children: 3   Years of  education: Not on file   Highest education level: Not on file  Occupational History   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  Tobacco Use   Smoking status: Never   Smokeless tobacco: Never  Substance and Sexual Activity   Alcohol use: Yes    Comment: socially   Drug use: No   Sexual activity: Not on file  Other Topics Concern   Not on file  Social History Narrative   Not on file   Social Determinants of Health   Financial Resource Strain: Not on file  Food Insecurity: No Food Insecurity (12/04/2022)   Hunger Vital Sign    Worried About Running Out of Food in the Last Year: Never true    Ran Out of Food in the Last Year: Never true  Transportation Needs: No Transportation Needs (12/04/2022)   PRAPARE - Administrator, Civil Service (Medical): No    Lack of Transportation (Non-Medical): No  Physical Activity: Not on file  Stress: Not on file  Social Connections: Not on file  Intimate Partner Violence: Not At Risk (12/04/2022)   Humiliation, Afraid, Rape, and Kick questionnaire    Fear of Current or Ex-Partner: No    Emotionally Abused: No    Physically Abused: No    Sexually Abused: No     ROS- All systems are reviewed and negative except as per the HPI above.  Physical Exam: Vitals:   12/20/22 0834  Weight: 94.7 kg  Height: 6\' 3"  (1.905 m)    GEN- The patient is a well appearing male, alert and oriented x 3 today.   Head- normocephalic, atraumatic Eyes-  Sclera clear, conjunctiva pink Ears- hearing intact Oropharynx- clear Neck- supple  Lungs- Clear to ausculation bilaterally, normal work of breathing Heart- Regular rate and rhythm, no murmurs, rubs or gallops  GI- soft, NT, ND, + BS Extremities- no clubbing, cyanosis, or edema MS- no significant deformity or atrophy Skin- no rash or lesion Psych- euthymic mood, full affect Neuro- strength and sensation are intact  Wt Readings from Last 3 Encounters:  12/20/22 94.7 kg  12/04/22 93 kg   05/02/22 93 kg    EKG today demonstrates  SB Vent. rate 53 BPM PR interval 152 ms QRS duration 84 ms QT/QTcB 434/407 ms  Echo 12/05/22 demonstrated  1. Left ventricular ejection fraction, by estimation, is 55 to 60%. The  left ventricle has normal function. The left ventricle has no regional  wall motion abnormalities. Left ventricular diastolic parameters were  normal.   2. Right ventricular systolic function is normal. The right ventricular  size is normal.   3. The mitral valve is normal in structure. Mild mitral valve  regurgitation. No evidence of mitral stenosis.   4. The aortic valve is normal in structure. Aortic valve regurgitation is  not visualized. No aortic stenosis is present.   5. The inferior vena cava is normal in size with greater than 50%  respiratory variability, suggesting right atrial pressure of 3 mmHg.  Epic records are reviewed at length today.  CHA2DS2-VASc Score = 2  The patient's score is based upon: CHF History: 0 HTN History: 1 Diabetes History: 0 Stroke History: 0 Vascular Disease History: 0 Age Score: 1 Gender Score: 0       ASSESSMENT AND PLAN: 1. Paroxysmal Atrial Fibrillation (ICD10:  I48.0) The patient's CHA2DS2-VASc score is 2, indicating a 2.2% annual risk of stroke.   General education about afib provided and questions answered. We also discussed his stroke risk and the risks and benefits of anticoagulation. ? If episode was precipitated by acute PE. Unclear duration of afib since he did not have any cardiac awareness of his afib. Will have him wear a 2 week Zio monitor to evaluate arrhythmia burden. We also discuss long term monitoring with Apple Watch, Kardia mobile, etc.  Continue diltiazem 120 mg daily Continue Eliquis 5 mg BID  2. Secondary Hypercoagulable State (ICD10:  D68.69) The patient is at significant risk for stroke/thromboembolism based upon his CHA2DS2-VASc Score of 2.  Continue Apixaban (Eliquis).   3.  HTN Stable, no changes today  4. Snoring/possible obstructive sleep apnea The importance of adequate treatment of sleep apnea was discussed today in order to improve our ability to maintain sinus rhythm long term. Patient has deferred sleep study for now and plans to discuss with Dr Excell Seltzer.   5. DVT/PE Continue Eliquis as above.   Follow up with Dr Excell Seltzer as scheduled.    Jorja Loa PA-C [facility] [address] [place], [place] [address] [phone] 12/20/2022 8:39 AM

## 2023-01-01 ENCOUNTER — Encounter: Payer: Self-pay | Admitting: Cardiovascular Disease

## 2023-01-01 ENCOUNTER — Ambulatory Visit: Payer: BC Managed Care – PPO | Attending: Cardiovascular Disease | Admitting: Cardiovascular Disease

## 2023-01-01 ENCOUNTER — Ambulatory Visit: Payer: BC Managed Care – PPO | Admitting: Cardiovascular Disease

## 2023-01-01 VITALS — BP 120/76 | HR 63 | Ht 75.0 in | Wt 208.2 lb

## 2023-01-01 DIAGNOSIS — I10 Essential (primary) hypertension: Secondary | ICD-10-CM | POA: Diagnosis not present

## 2023-01-01 DIAGNOSIS — E782 Mixed hyperlipidemia: Secondary | ICD-10-CM | POA: Diagnosis not present

## 2023-01-01 DIAGNOSIS — I480 Paroxysmal atrial fibrillation: Secondary | ICD-10-CM | POA: Diagnosis not present

## 2023-01-01 NOTE — Patient Instructions (Signed)
Medication Instructions:  Your physician recommends that you continue on your current medications as directed. Please refer to the Current Medication list given to you today.  *If you need a refill on your cardiac medications before your next appointment, please call your pharmacy*  Follow-Up: At [facility], you and your health needs are our priority.  As part of our continuing mission to provide you with exceptional heart care, we have created designated Provider Care Teams.  These Care Teams include your primary Cardiologist (physician) and Advanced Practice Providers (APPs -  Physician Assistants and Nurse Practitioners) who all work together to provide you with the care you need, when you need it.   Your next appointment:   6 month(s)  Provider:   Michael Cooper, MD      

## 2023-01-01 NOTE — Progress Notes (Signed)
Cardiology Office Note:    Date:  01/04/2023   ID:  Mathew Razzano., DOB Jan 16, 1956, MRN [id number]  PCP:  Corwin Levins, MD   [facility] Providers Cardiologist:  None     Referring MD: [place], Eric J, DO   Chief Complaint  Patient presents with   Atrial Fibrillation    History of Present Illness:    Mathew Sanchez. is a 67 y.o. male presents for evaluation of atrial fibrillation. He was recently hospitalized with DVT/PE on 12/04/2022. He developed pain in the left calf and thigh prior to developing pleuritic chest pain. When seen in the ER, he was found to be in AF with RVR. He converted spontaneously back to sinus rhythm on a diltiazem drip and his amlodipine was changed to diltiazem at discharge. He was started on apixaban for PE and also for atrial fibrillation. He has done well since hospital discharge, but continues to feel fatigued. He denies chest pain, shortness of breath, edema, or heart palpitations. He has no other cardiac history. He has a history of HTN and hyperlipidemia, both well-controlled over time.   Past Medical History:  Diagnosis Date   ACNE NEC 07/29/2008   ALLERGIC RHINITIS 07/29/2008   Chronic kidney disease    kidney stones   COLONIC POLYPS, HX OF 07/29/2008   ERECTILE DYSFUNCTION 07/29/2008   HYPERLIPIDEMIA 07/29/2008   HYPERTENSION 07/29/2008   NEPHROLITHIASIS, HX OF 07/29/2008   PSA, INCREASED 07/29/2008    Past Surgical History:  Procedure Laterality Date   COLONOSCOPY     POLYPECTOMY     TONSILLECTOMY      Current Medications: Current Meds  Medication Sig   [START ON 01/05/2023] apixaban (ELIQUIS) 5 MG TABS tablet Take 1 tablet (5 mg total) by mouth 2 (two) times daily.   APIXABAN (ELIQUIS) VTE STARTER PACK (10MG  AND 5MG ) Take as directed on package: start with two-5mg  tablets twice daily for 7 days. On day 8, switch to one-5mg  tablet twice daily.   atorvastatin (LIPITOR) 10 MG tablet Take 1 tablet (10 mg total) by mouth  daily.   benazepril (LOTENSIN) 20 MG tablet Take 1 tablet (20 mg total) by mouth daily.   [START ON 01/05/2023] benazepril (LOTENSIN) 20 MG tablet Take 1 tablet (20 mg total) by mouth daily.   diltiazem (CARDIZEM CD) 120 MG 24 hr capsule Take 1 capsule (120 mg total) by mouth daily.   [START ON 01/05/2023] diltiazem (CARDIZEM CD) 120 MG 24 hr capsule Take 1 capsule (120 mg total) by mouth daily.     Allergies:   Patient has no active allergies.   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  Tobacco Use   Smoking status: Never   Smokeless tobacco: Never  Substance and Sexual Activity   Alcohol use: Yes    Comment: socially   Drug use: No   Sexual activity: Not on file  Other Topics Concern   Not on file  Social History Narrative   Not on file   Social Determinants of Health   Financial Resource Strain: Not on file  Food Insecurity: No Food Insecurity (12/04/2022)   Hunger Vital Sign    Worried About Running Out of Food in the Last Year:  Never true    Ran Out of Food in the Last Year: Never true  Transportation Needs: No Transportation Needs (12/04/2022)   PRAPARE - Administrator, Civil Service (Medical): No    Lack of Transportation (Non-Medical): No  Physical Activity: Not on file  Stress: Not on file  Social Connections: Not on file     Family History: The patient's family history is negative for Colon cancer, Rectal cancer, Stomach cancer, and Esophageal cancer.  ROS:   Please see the history of present illness.    All other systems reviewed and are negative.  EKGs/Labs/Other Studies Reviewed:    The following studies were reviewed today: Cardiac Studies & Procedures       ECHOCARDIOGRAM  ECHOCARDIOGRAM COMPLETE  12/05/2022  Narrative ECHOCARDIOGRAM REPORT    Patient Name:   Mathew Sanchez. Date of Exam: 12/05/2022 Medical Rec #:  [id number]              Height:       75.0 in Accession #:    [id number]             Weight:       205.0 lb Date of Birth:  [birth year]              BSA:          2.218 m Patient Age:    67 years               BP:           125/67 mmHg Patient Gender: M                      HR:           74 bpm. Exam Location:  Inpatient  Procedure: 2D Echo, Color Doppler and Cardiac Doppler  Indications:    Atrial fibrillation  History:        Patient has no prior history of Echocardiogram examinations. Risk Factors:Hypertension and Dyslipidemia. CKD.  Sonographer:    Milda Smart Referring Phys: [id number] MIR [facility]   Sonographer Comments: Image acquisition challenging due to respiratory motion. IMPRESSIONS   1. Left ventricular ejection fraction, by estimation, is 55 to 60%. The left ventricle has normal function. The left ventricle has no regional wall motion abnormalities. Left ventricular diastolic parameters were normal. 2. Right ventricular systolic function is normal. The right ventricular size is normal. 3. The mitral valve is normal in structure. Mild mitral valve regurgitation. No evidence of mitral stenosis. 4. The aortic valve is normal in structure. Aortic valve regurgitation is not visualized. No aortic stenosis is present. 5. The inferior vena cava is normal in size with greater than 50% respiratory variability, suggesting right atrial pressure of 3 mmHg.  FINDINGS Left Ventricle: Left ventricular ejection fraction, by estimation, is 55 to 60%. The left ventricle has normal function. The left ventricle has no regional wall motion abnormalities. The left ventricular internal cavity size was normal in size. There is no left ventricular hypertrophy. Left ventricular diastolic parameters were normal.  Right Ventricle: The right ventricular size is  normal. No increase in right ventricular wall thickness. Right ventricular systolic function is normal.  Left Atrium: Left atrial size was normal in size.  Right Atrium: Right atrial size was normal in size.  Pericardium: There is no evidence of pericardial effusion.  Mitral Valve: The mitral valve is normal in structure. Mild mitral valve regurgitation. No evidence of mitral valve  stenosis.  Tricuspid Valve: The tricuspid valve is normal in structure. Tricuspid valve regurgitation is not demonstrated. No evidence of tricuspid stenosis.  Aortic Valve: The aortic valve is normal in structure. Aortic valve regurgitation is not visualized. No aortic stenosis is present.  Pulmonic Valve: The pulmonic valve was normal in structure. Pulmonic valve regurgitation is not visualized. No evidence of pulmonic stenosis.  Aorta: The aortic root is normal in size and structure.  Venous: The inferior vena cava is normal in size with greater than 50% respiratory variability, suggesting right atrial pressure of 3 mmHg.  IAS/Shunts: No atrial level shunt detected by color flow Doppler.   LEFT VENTRICLE PLAX 2D LVIDd:         4.70 cm      Diastology LVIDs:         3.00 cm      LV e' medial:    7.07 cm/s LV PW:         1.10 cm      LV E/e' medial:  8.0 LV IVS:        0.80 cm      LV e' lateral:   7.18 cm/s LVOT diam:     2.60 cm      LV E/e' lateral: 7.9 LV SV:         103 LV SV Index:   46 LVOT Area:     5.31 cm  LV Volumes (MOD) LV vol d, MOD A2C: 78.0 ml LV vol d, MOD A4C: 105.0 ml LV vol s, MOD A2C: 27.8 ml LV vol s, MOD A4C: 45.2 ml LV SV MOD A2C:     50.2 ml LV SV MOD A4C:     105.0 ml LV SV MOD BP:      55.7 ml  RIGHT VENTRICLE             IVC RV S prime:     10.40 cm/s  IVC diam: 1.30 cm TAPSE (M-mode): 2.2 cm  LEFT ATRIUM             Index        RIGHT ATRIUM           Index LA diam:        3.90 cm 1.76 cm/m   RA Area:     14.30 cm LA Vol (A2C):   40.0 ml 18.04 ml/m  RA  Volume:   35.70 ml  16.10 ml/m LA Vol (A4C):   38.9 ml 17.54 ml/m LA Biplane Vol: 39.5 ml 17.81 ml/m AORTIC VALVE LVOT Vmax:   95.30 cm/s LVOT Vmean:  70.100 cm/s LVOT VTI:    0.194 m  AORTA Ao Root diam: 3.40 cm Ao Asc diam:  3.50 cm  MITRAL VALVE               TRICUSPID VALVE MV Area (PHT): 3.50 cm    TR Peak grad:   9.1 mmHg MV Decel Time: 217 msec    TR Vmax:        151.00 cm/s MR Peak grad: 14.9 mmHg MR Vmax:      193.00 cm/s  SHUNTS MV E velocity: 56.60 cm/s  Systemic VTI:  0.19 m MV A velocity: 53.60 cm/s  Systemic Diam: 2.60 cm MV E/A ratio:  1.06  Aditya Sabharwal Electronically signed by Dorthula Nettles Signature Date/Time: 12/05/2022/4:12:55 PM    Final              EKG:  Recent Labs: 12/04/2022: B Natriuretic Peptide 38.3; Magnesium 2.1; TSH 1.381 12/05/2022: ALT 13; BUN 22; Creatinine, Ser 1.31; Potassium 3.9; Sodium 139 12/06/2022: Hemoglobin 13.5; Platelets 260  Recent Lipid Panel    Component Value Date/Time   CHOL 146 04/25/2022 0735   TRIG 94.0 04/25/2022 0735   HDL 40.30 04/25/2022 0735   CHOLHDL 4 04/25/2022 0735   VLDL 18.8 04/25/2022 0735   LDLCALC 87 04/25/2022 0735   LDLDIRECT 144.7 10/09/2010 0752     Risk Assessment/Calculations:    CHA2DS2-VASc Score = 2   This indicates a 2.2% annual risk of stroke. The patient's score is based upon: CHF History: 0 HTN History: 1 Diabetes History: 0 Stroke History: 0 Vascular Disease History: 0 Age Score: 1 Gender Score: 0         Physical Exam:    VS:  BP 120/76   Pulse 63   Ht 6\' 3"  (1.905 m)   Wt 208 lb 3.2 oz (94.4 kg)   SpO2 98%   BMI 26.02 kg/m     Wt Readings from Last 3 Encounters:  01/01/23 208 lb 3.2 oz (94.4 kg)  12/20/22 208 lb 12.8 oz (94.7 kg)  12/04/22 205 lb 0.4 oz (93 kg)     GEN:  Well nourished, well developed in no acute distress HEENT: Normal NECK: No JVD; No carotid bruits LYMPHATICS: No lymphadenopathy CARDIAC: RRR, no murmurs, rubs,  gallops RESPIRATORY:  Clear to auscultation without rales, wheezing or rhonchi  ABDOMEN: Soft, non-tender, non-distended MUSCULOSKELETAL:  No edema; No deformity  SKIN: Warm and dry NEUROLOGIC:  Alert and oriented x 3 PSYCHIATRIC:  Normal affect   ASSESSMENT:    1. Paroxysmal atrial fibrillation (HCC)   2. Essential hypertension   3. Mixed hyperlipidemia    PLAN:    In order of problems listed above:  Maintaining sinus rhythm. Pt did not have any symptoms associated with his atrial fibrillation, and this occurred in the setting of acute pulmonary embolus. It seems reasonable to continue oral anticoagulation for 6 months, while he has an indication for treatment of PE. I recommended that he buy a smart watch and moitor his heart rate periodically. When he was in AF, his HR was > 140 bpm and his sinus rate is in the 60's. His CHADS-Vasc = 2. We could consider an outpatient monitor in the future, but if no further AF is detected, it would be reasonable to stop anticoagulation after 6 months as his AF occurred in the setting of acute PE.  BP well-controlled. If his symptoms of fatigue don't improve, could consider changing him back from diltiazem to amlodipine.  Treated with atorvastatin.            Medication Adjustments/Labs and Tests Ordered: Current medicines are reviewed at length with the patient today.  Concerns regarding medicines are outlined above.  No orders of the defined types were placed in this encounter.  No orders of the defined types were placed in this encounter.   Patient Instructions  Medication Instructions:  Your physician recommends that you continue on your current medications as directed. Please refer to the Current Medication list given to you today.  *If you need a refill on your cardiac medications before your next appointment, please call your pharmacy*    Follow-Up: At [facility], you and your health needs are our priority.  As part of  our continuing mission to provide you with exceptional heart care, we have created designated Provider Care Teams.  These  Care Teams include your primary Cardiologist (physician) and Advanced Practice Providers (APPs -  Physician Assistants and Nurse Practitioners) who all work together to provide you with the care you need, when you need it.    Your next appointment:   6 month(s)  Provider:   Tonny Bollman, MD   Signed, Tonny Bollman, MD  01/04/2023 9:51 PM    [facility]

## 2023-01-04 ENCOUNTER — Encounter: Payer: Self-pay | Admitting: Cardiovascular Disease

## 2023-01-07 DIAGNOSIS — I480 Paroxysmal atrial fibrillation: Secondary | ICD-10-CM | POA: Diagnosis not present

## 2023-01-08 NOTE — Addendum Note (Signed)
Encounter addended by: Shona Simpson, RN on: 01/08/2023 8:43 AM  Actions taken: Imaging Exam ended

## 2023-01-10 ENCOUNTER — Telehealth: Payer: Self-pay | Admitting: Radiology

## 2023-01-10 ENCOUNTER — Encounter (HOSPITAL_COMMUNITY): Payer: Self-pay

## 2023-01-10 NOTE — Telephone Encounter (Signed)
Left voice mail for patient to call back at [phone] to schedule Medicare Annual Wellness Visit    Last AWV:  no hx of AWV   Please schedule Sequential/Initial AWV with [facility]   Leontine Radman K. CMA   

## 2023-01-24 DIAGNOSIS — L570 Actinic keratosis: Secondary | ICD-10-CM | POA: Diagnosis not present

## 2023-01-24 DIAGNOSIS — D485 Neoplasm of uncertain behavior of skin: Secondary | ICD-10-CM | POA: Diagnosis not present

## 2023-01-24 DIAGNOSIS — C44111 Basal cell carcinoma of skin of unspecified eyelid, including canthus: Secondary | ICD-10-CM | POA: Diagnosis not present

## 2023-02-03 ENCOUNTER — Encounter (HOSPITAL_COMMUNITY): Payer: Self-pay | Admitting: Physician Assistant

## 2023-02-03 ENCOUNTER — Ambulatory Visit (HOSPITAL_COMMUNITY)
Admission: RE | Admit: 2023-02-03 | Discharge: 2023-02-03 | Disposition: A | Discharge status: Home / Self Care | Payer: BC Managed Care – PPO | Source: Ambulatory Visit | Attending: Physician Assistant | Admitting: Physician Assistant

## 2023-02-03 VITALS — BP 118/82 | HR 53 | Ht 75.0 in | Wt 209.8 lb

## 2023-02-03 DIAGNOSIS — N189 Chronic kidney disease, unspecified: Secondary | ICD-10-CM | POA: Diagnosis not present

## 2023-02-03 DIAGNOSIS — D6869 Other thrombophilia: Secondary | ICD-10-CM | POA: Diagnosis not present

## 2023-02-03 DIAGNOSIS — R0683 Snoring: Secondary | ICD-10-CM | POA: Diagnosis not present

## 2023-02-03 DIAGNOSIS — I129 Hypertensive chronic kidney disease with stage 1 through stage 4 chronic kidney disease, or unspecified chronic kidney disease: Secondary | ICD-10-CM | POA: Insufficient documentation

## 2023-02-03 DIAGNOSIS — Z7901 Long term (current) use of anticoagulants: Secondary | ICD-10-CM | POA: Diagnosis not present

## 2023-02-03 DIAGNOSIS — I480 Paroxysmal atrial fibrillation: Secondary | ICD-10-CM | POA: Diagnosis not present

## 2023-02-03 DIAGNOSIS — E785 Hyperlipidemia, unspecified: Secondary | ICD-10-CM | POA: Insufficient documentation

## 2023-02-03 MED ORDER — DILTIAZEM HCL ER COATED BEADS 120 MG PO CP24: 120.0000 mg | ORAL_CAPSULE | Freq: Every day | ORAL | 2 refills | Status: DC

## 2023-02-03 MED ORDER — BENAZEPRIL HCL 20 MG PO TABS: 20.0000 mg | ORAL_TABLET | Freq: Every day | ORAL | 2 refills | Status: DC

## 2023-02-03 MED ORDER — APIXABAN 5 MG PO TABS: 5.0000 mg | ORAL_TABLET | Freq: Two times a day (BID) | ORAL | 2 refills | Status: DC

## 2023-02-03 NOTE — Addendum Note (Signed)
Encounter addended by: Shona Simpson, RN on: 02/03/2023 9:18 AM  Actions taken: Order list changed, Diagnosis association updated

## 2023-02-03 NOTE — Progress Notes (Signed)
Primary Care Physician: Corwin Levins, MD Primary Cardiologist: new to Dr Excell Seltzer Primary Electrophysiologist: none Referring Physician: Dr [place]    Mathew H Menz Jr. is a 67 y.o. male with a history of HTN, HLD, CKD, atrial fibrillation who presents for follow up in the [facility]. He presented to [facility] ED on 5/22 with complaint of right-sided abdominal pain, shortness of breath exacerbated while lying flat. Patient had telehealth visit with urgent care and was advised to seek further evaluation in the ED. CT angiogram chest with multiple partially obstructing pulmonary emboli bilaterally prominently within the segmental and subsegmental branches of the right lower lobe. Also had left lower extremity DVT. ECG on admission showed rapid afib, started on diltiazem drip. He spontaneously converted to NSR during his admission. Patient is on Eliquis for a CHADS2VASC score of 2. He did not have any tachypalpitations when in afib, unsure what was the duration of the episode. He does consume alcohol and he does snore. While admitted, he did have nocturnal desaturations.   Patient wore a Zio monitor which showed 2% afib burden avg rate 97 bpm. He does state that he can sometimes feel "fluttering" when he lays down at night. He is tolerating anticoagulation without bleeding issues.   Today, he denies symptoms of chest pain, shortness of breath, orthopnea, PND, lower extremity edema, dizziness, presyncope, syncope, daytime somnolence, bleeding, or neurologic sequela. The patient is tolerating medications without difficulties and is otherwise without complaint today.    Atrial Fibrillation Risk Factors:  he does have symptoms or diagnosis of sleep apnea. he does not have a history of rheumatic fever. he does have a history of alcohol use. The patient does not have a history of early familial atrial fibrillation or other arrhythmias.   Atrial  Fibrillation Management history:  Previous antiarrhythmic drugs: none Previous cardioversions: none Previous ablations: none Anticoagulation history: Eliquis   Past Medical History:  Diagnosis Date   ACNE NEC 07/29/2008   ALLERGIC RHINITIS 07/29/2008   Chronic kidney disease    kidney stones   COLONIC POLYPS, HX OF 07/29/2008   ERECTILE DYSFUNCTION 07/29/2008   HYPERLIPIDEMIA 07/29/2008   HYPERTENSION 07/29/2008   NEPHROLITHIASIS, HX OF 07/29/2008   PSA, INCREASED 07/29/2008    ROS- All systems are reviewed and negative except as per the HPI above.  Physical Exam: Vitals:   02/03/23 0813  BP: 118/82  Pulse: (!) 53  Weight: 95.2 kg  Height: 6\' 3"  (1.905 m)    GEN: Well nourished, well developed in no acute distress NECK: No JVD; No carotid bruits CARDIAC: Regular rate and rhythm, no murmurs, rubs, gallops RESPIRATORY:  Clear to auscultation without rales, wheezing or rhonchi  ABDOMEN: Soft, non-tender, non-distended EXTREMITIES:  No edema; No deformity    Wt Readings from Last 3 Encounters:  02/03/23 95.2 kg  01/01/23 94.4 kg  12/20/22 94.7 kg    EKG today demonstrates  SB Vent. rate 53 BPM PR interval 158 ms QRS duration 82 ms QT/QTcB 438/410 ms  Echo 12/05/22 demonstrated  1. Left ventricular ejection fraction, by estimation, is 55 to 60%. The  left ventricle has normal function. The left ventricle has no regional  wall motion abnormalities. Left ventricular diastolic parameters were  normal.   2. Right ventricular systolic function is normal. The right ventricular  size is normal.   3. The mitral valve is normal in structure. Mild mitral valve  regurgitation. No evidence of mitral stenosis.   4.  The aortic valve is normal in structure. Aortic valve regurgitation is  not visualized. No aortic stenosis is present.   5. The inferior vena cava is normal in size with greater than 50%  respiratory variability, suggesting right atrial pressure of 3 mmHg.    Epic  records are reviewed at length today.  CHA2DS2-VASc Score = 3  The patient's score is based upon: CHF History: 0 HTN History: 1 Diabetes History: 0 Stroke History: 0 Vascular Disease History: 1 (aortic atherosclerosis) Age Score: 1 Gender Score: 0        ASSESSMENT AND PLAN: Paroxysmal Atrial Fibrillation (ICD10:  I48.0) The patient's CHA2DS2-VASc score is 3, indicating a 3.2% annual risk of stroke.   Zio monitor showed 2% afib burden, has symptoms of palpitations.  Would recommend that he continue on anticoagulation long term. We discussed rhythm control options today. He would like to avoid long term medication and is agreeable to consultation with EP to discuss ablation.  Continue diltiazem 120 mg daily Continue Eliquis 5 mg BID  Secondary Hypercoagulable State (ICD10:  D68.69) The patient is at significant risk for stroke/thromboembolism based upon his CHA2DS2-VASc Score of 3.  Continue Apixaban (Eliquis).   HTN Stable on current regimen  Snoring/nocturnal desaturations The importance of adequate treatment of sleep apnea was discussed today in order to improve our ability to maintain sinus rhythm long term. Will refer for sleep study.     Follow up with EP to establish care/discuss ablation.    Jorja Loa PA-C [facility] [address] [place], [place] [address] [phone] 02/03/2023 8:30 AM

## 2023-02-05 ENCOUNTER — Encounter: Payer: Self-pay | Admitting: Cardiovascular Disease

## 2023-02-06 ENCOUNTER — Ambulatory Visit: Payer: BC Managed Care – PPO

## 2023-02-06 VITALS — Ht 75.0 in | Wt 208.0 lb

## 2023-02-06 DIAGNOSIS — Z0000 Encounter for general adult medical examination without abnormal findings: Secondary | ICD-10-CM

## 2023-02-06 NOTE — Patient Instructions (Signed)
Mathew Sanchez , Thank you for taking time to come for your Medicare Wellness Visit. I appreciate your ongoing commitment to your health goals. Please review the following plan we discussed and let me know if I can assist you in the future.   Referrals/Orders/Follow-Ups/Clinician Recommendations: No  This is a list of the screening recommended for you and due dates:  Health Maintenance  Topic Date Due   DTaP/Tdap/Td vaccine (2 - Tdap) 09/19/2019   COVID-19 Vaccine (4 - 2023-24 season) 03/15/2022   Pneumonia Vaccine (1 of 1 - PCV) 05/03/2023*   Colon Cancer Screening  05/03/2023*   Flu Shot  02/13/2023   Hepatitis C Screening  Completed   Zoster (Shingles) Vaccine  Completed   HPV Vaccine  Aged Out  *Topic was postponed. The date shown is not the original due date.    Advanced directives: (Copy Requested) Please bring a copy of your health care power of attorney and living will to the office to be added to your chart at your convenience.  Next Medicare Annual Wellness Visit scheduled for next year: No  Preventive Care 28 Years and Older, Male  Preventive care refers to lifestyle choices and visits with your health care provider that can promote health and wellness. What does preventive care include? A yearly physical exam. This is also called an annual well check. Dental exams once or twice a year. Routine eye exams. Ask your health care provider how often you should have your eyes checked. Personal lifestyle choices, including: Daily care of your teeth and gums. Regular physical activity. Eating a healthy diet. Avoiding tobacco and drug use. Limiting alcohol use. Practicing safe sex. Taking low doses of aspirin every day. Taking vitamin and mineral supplements as recommended by your health care provider. What happens during an annual well check? The services and screenings done by your health care provider during your annual well check will depend on your age, overall health,  lifestyle risk factors, and family history of disease. Counseling  Your health care provider may ask you questions about your: Alcohol use. Tobacco use. Drug use. Emotional well-being. Home and relationship well-being. Sexual activity. Eating habits. History of falls. Memory and ability to understand (cognition). Work and work Astronomer. Screening  You may have the following tests or measurements: Height, weight, and BMI. Blood pressure. Lipid and cholesterol levels. These may be checked every 5 years, or more frequently if you are over 48 years old. Skin check. Lung cancer screening. You may have this screening every year starting at age 12 if you have a 30-pack-year history of smoking and currently smoke or have quit within the past 15 years. Fecal occult blood test (FOBT) of the stool. You may have this test every year starting at age 53. Flexible sigmoidoscopy or colonoscopy. You may have a sigmoidoscopy every 5 years or a colonoscopy every 10 years starting at age 68. Prostate cancer screening. Recommendations will vary depending on your family history and other risks. Hepatitis C blood test. Hepatitis B blood test. Sexually transmitted disease (STD) testing. Diabetes screening. This is done by checking your blood sugar (glucose) after you have not eaten for a while (fasting). You may have this done every 1-3 years. Abdominal aortic aneurysm (AAA) screening. You may need this if you are a current or former smoker. Osteoporosis. You may be screened starting at age 9 if you are at high risk. Talk with your health care provider about your test results, treatment options, and if necessary, the need for  more tests. Vaccines  Your health care provider may recommend certain vaccines, such as: Influenza vaccine. This is recommended every year. Tetanus, diphtheria, and acellular pertussis (Tdap, Td) vaccine. You may need a Td booster every 10 years. Zoster vaccine. You may need this  after age 67. Pneumococcal 13-valent conjugate (PCV13) vaccine. One dose is recommended after age 75. Pneumococcal polysaccharide (PPSV23) vaccine. One dose is recommended after age 63. Talk to your health care provider about which screenings and vaccines you need and how often you need them. This information is not intended to replace advice given to you by your health care provider. Make sure you discuss any questions you have with your health care provider. Document Released: 07/28/2015 Document Revised: 03/20/2016 Document Reviewed: 05/02/2015 Elsevier Interactive Patient Education  2017 [employer].  Fall Prevention in the Home Falls can cause injuries. They can happen to people of all ages. There are many things you can do to make your home safe and to help prevent falls. What can I do on the outside of my home? Regularly fix the edges of walkways and driveways and fix any cracks. Remove anything that might make you trip as you walk through a door, such as a raised step or threshold. Trim any bushes or trees on the path to your home. Use bright outdoor lighting. Clear any walking paths of anything that might make someone trip, such as rocks or tools. Regularly check to see if handrails are loose or broken. Make sure that both sides of any steps have handrails. Any raised decks and porches should have guardrails on the edges. Have any leaves, snow, or ice cleared regularly. Use sand or salt on walking paths during winter. Clean up any spills in your garage right away. This includes oil or grease spills. What can I do in the bathroom? Use night lights. Install grab bars by the toilet and in the tub and shower. Do not use towel bars as grab bars. Use non-skid mats or decals in the tub or shower. If you need to sit down in the shower, use a plastic, non-slip stool. Keep the floor dry. Clean up any water that spills on the floor as soon as it happens. Remove soap buildup in the tub or  shower regularly. Attach bath mats securely with double-sided non-slip rug tape. Do not have throw rugs and other things on the floor that can make you trip. What can I do in the bedroom? Use night lights. Make sure that you have a light by your bed that is easy to reach. Do not use any sheets or blankets that are too big for your bed. They should not hang down onto the floor. Have a firm chair that has side arms. You can use this for support while you get dressed. Do not have throw rugs and other things on the floor that can make you trip. What can I do in the kitchen? Clean up any spills right away. Avoid walking on wet floors. Keep items that you use a lot in easy-to-reach places. If you need to reach something above you, use a strong step stool that has a grab bar. Keep electrical cords out of the way. Do not use floor polish or wax that makes floors slippery. If you must use wax, use non-skid floor wax. Do not have throw rugs and other things on the floor that can make you trip. What can I do with my stairs? Do not leave any items on the stairs. Make  sure that there are handrails on both sides of the stairs and use them. Fix handrails that are broken or loose. Make sure that handrails are as long as the stairways. Check any carpeting to make sure that it is firmly attached to the stairs. Fix any carpet that is loose or worn. Avoid having throw rugs at the top or bottom of the stairs. If you do have throw rugs, attach them to the floor with carpet tape. Make sure that you have a light switch at the top of the stairs and the bottom of the stairs. If you do not have them, ask someone to add them for you. What else can I do to help prevent falls? Wear shoes that: Do not have high heels. Have rubber bottoms. Are comfortable and fit you well. Are closed at the toe. Do not wear sandals. If you use a stepladder: Make sure that it is fully opened. Do not climb a closed stepladder. Make  sure that both sides of the stepladder are locked into place. Ask someone to hold it for you, if possible. Clearly mark and make sure that you can see: Any grab bars or handrails. First and last steps. Where the edge of each step is. Use tools that help you move around (mobility aids) if they are needed. These include: Canes. Walkers. Scooters. Crutches. Turn on the lights when you go into a dark area. Replace any light bulbs as soon as they burn out. Set up your furniture so you have a clear path. Avoid moving your furniture around. If any of your floors are uneven, fix them. If there are any pets around you, be aware of where they are. Review your medicines with your doctor. Some medicines can make you feel dizzy. This can increase your chance of falling. Ask your doctor what other things that you can do to help prevent falls. This information is not intended to replace advice given to you by your health care provider. Make sure you discuss any questions you have with your health care provider. Document Released: 04/27/2009 Document Revised: 12/07/2015 Document Reviewed: 08/05/2014 Elsevier Interactive Patient Education  2017 [employer].

## 2023-02-06 NOTE — Progress Notes (Signed)
Subjective:   Mathew Sanchez. is a 67 y.o. male who presents for an Initial Medicare Annual Wellness Visit.  Visit Complete: Virtual  I connected with  Dayna Ramus. on 02/06/23 by a audio enabled telemedicine application and verified that I am speaking with the correct person using two identifiers.  Patient Location: Home  Provider Location: Office/Clinic  I discussed the limitations of evaluation and management by telemedicine. The patient expressed understanding and agreed to proceed.  Patient Medicare AWV questionnaire was completed by the patient on 02/03/2023; I have confirmed that all information answered by patient is correct and no changes since this date.  Vital Signs: Unable to obtain new vitals due to this being a telehealth visit.  Review of Systems     Cardiac Risk Factors include: advanced age (>34men, >39 women);dyslipidemia;hypertension;male gender     Objective:    Today's Vitals   02/06/23 1239 02/06/23 1240  Weight: 208 lb (94.3 kg)   Height: 6\' 3"  (1.905 m)   PainSc: 0-No pain 0-No pain   Body mass index is 26 kg/m.     02/06/2023   12:45 PM 12/04/2022    9:38 AM 09/17/2016    2:45 PM  Advanced Directives  Does Patient Have a Medical Advance Directive? Yes No No  Type of Estate agent of [place];Living will    Copy of Healthcare Power of Attorney in Chart? No - copy requested    Would patient like information on creating a medical advance directive?  No - Patient declined     Current Medications (verified) Outpatient Encounter Medications as of 02/06/2023  Medication Sig   apixaban (ELIQUIS) 5 MG TABS tablet Take 1 tablet (5 mg total) by mouth 2 (two) times daily.   atorvastatin (LIPITOR) 10 MG tablet Take 1 tablet (10 mg total) by mouth daily.   benazepril (LOTENSIN) 20 MG tablet Take 1 tablet (20 mg total) by mouth daily.   diltiazem (CARDIZEM CD) 120 MG 24 hr capsule Take 1 capsule (120 mg total) by mouth  daily.   No facility-administered encounter medications on file as of 02/06/2023.    Allergies (verified) Patient has no known allergies.   History: Past Medical History:  Diagnosis Date   ACNE NEC 07/29/2008   ALLERGIC RHINITIS 07/29/2008   Chronic kidney disease    kidney stones   COLONIC POLYPS, HX OF 07/29/2008   ERECTILE DYSFUNCTION 07/29/2008   HYPERLIPIDEMIA 07/29/2008   HYPERTENSION 07/29/2008   NEPHROLITHIASIS, HX OF 07/29/2008   PSA, INCREASED 07/29/2008   Past Surgical History:  Procedure Laterality Date   COLONOSCOPY     POLYPECTOMY     TONSILLECTOMY     Family History  Problem Relation Age of Onset   Colon cancer Neg Hx    Rectal cancer Neg Hx    Stomach cancer Neg Hx    Esophageal cancer Neg Hx    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  Tobacco Use   Smoking status: Never   Smokeless tobacco: Never  Vaping Use   Vaping status: Never Used  Substance and Sexual Activity   Alcohol use: Yes    Comment: socially   Drug use: No   Sexual activity: Not on  file  Other Topics Concern   Not on file  Social History Narrative   Not on file   Social Determinants of Health   Financial Resource Strain: Low Risk  (02/06/2023)   Overall Financial Resource Strain (CARDIA)    Difficulty of Paying Living Expenses: Not hard at all  Food Insecurity: No Food Insecurity (02/06/2023)   Hunger Vital Sign    Worried About Running Out of Food in the Last Year: Never true    Ran Out of Food in the Last Year: Never true  Transportation Needs: No Transportation Needs (02/06/2023)   PRAPARE - Administrator, Civil Service (Medical): No    Lack of Transportation (Non-Medical): No  Physical Activity: Insufficiently Active (02/06/2023)   Exercise  Vital Sign    Days of Exercise per Week: 4 days    Minutes of Exercise per Session: 30 min  Stress: No Stress Concern Present (02/06/2023)   [employer] - Occupational Stress Questionnaire    Feeling of Stress : Only a little  Social Connections: Unknown (02/06/2023)   Social Connection and Isolation Panel [NHANES]    Frequency of Communication with Friends and Family: More than three times a week    Frequency of Social Gatherings with Friends and Family: More than three times a week    Attends Religious Services: Not on Marketing executive or Organizations: No    Attends Engineer, structural: 1 to 4 times per year    Marital Status: Married    Tobacco Counseling Counseling given: Not Answered   Clinical Intake:  Pre-visit preparation completed: Yes  Pain : No/denies pain Pain Score: 0-No pain     BMI - recorded: 26 Nutritional Status: BMI 25 -29 Overweight Nutritional Risks: None Diabetes: No  How often do you need to have someone help you when you read instructions, pamphlets, or other written materials from your doctor or pharmacy?: 1 - Never What is the last grade level you completed in school?: HSG  Interpreter Needed?: No  Information entered by :: Lucelia Lacey N. Jaydee Conran, LPN.   Activities of Daily Living    02/06/2023   12:46 PM 02/03/2023    7:43 AM  In your present state of health, do you have any difficulty performing the following activities:  Hearing? 0 0  Vision? 0 0  Difficulty concentrating or making decisions? 0 0  Walking or climbing stairs? 0 0  Dressing or bathing? 0 0  Doing errands, shopping? 0 0  Preparing Food and eating ? N N  Using the Toilet? N N  In the past six months, have you accidently leaked urine? N N  Do you have problems with loss of bowel control? N N  Managing your Medications? N N  Managing your Finances? N N  Housekeeping or managing your Housekeeping? N N    Patient Care  Team: Corwin Levins, MD as PCP - General (Internal Medicine) [facility], [place] as Consulting Physician (Optometry)  Indicate any recent Medical Services you may have received from other than [facility] providers in the past year (date may be approximate).     Assessment:   This is a routine wellness examination for [place].  Hearing/Vision screen Hearing Screening - Comments:: Denies hearing difficulties   Vision Screening - Comments:: Wears rx glasses - up to date with routine eye exams with [facility]   Dietary issues and exercise activities discussed:  Goals Addressed               This Visit's Progress     My healthcare goal is to maintain my diagnosis of A-Fib (pt-stated)        After being diagnosed with AFib, the ideal goals may include: Restoring the heart to a normal rhythm (called rhythm control) Reducing an overly high heart rate (called rate control) Preventing blood clots Managing risk factors for stroke Preventing additional heart rhythm problems Preventing heart failure      Depression Screen    02/06/2023   12:44 PM 05/02/2022    8:17 AM 05/02/2022    8:09 AM 04/13/2021    8:19 AM 04/13/2021    8:01 AM 10/16/2020    8:06 AM 04/11/2020    9:22 AM  PHQ 2/9 Scores  PHQ - 2 Score 0 0 0 0 0 0 0  PHQ- 9 Score 0  1        Fall Risk    02/06/2023   12:46 PM 02/03/2023    7:43 AM 05/02/2022    8:17 AM 05/02/2022    8:09 AM 04/13/2021    8:18 AM  Fall Risk   Falls in the past year? 0 0 0 0 0  Number falls in past yr: 0 0 0  0  Injury with Fall? 0 0 0  0  Risk for fall due to : No Fall Risks   No Fall Risks   Follow up Falls prevention discussed   Falls evaluation completed     MEDICARE RISK AT HOME:  Medicare Risk at Home - 02/06/23 1246     Any stairs in or around the home? Yes    If so, are there any without handrails? No    Home free of loose throw rugs in walkways, pet beds, electrical cords, etc? Yes    Adequate lighting in your  home to reduce risk of falls? Yes    Life alert? No    Use of a cane, walker or w/c? No    Grab bars in the bathroom? No    Shower chair or bench in shower? No    Elevated toilet seat or a handicapped toilet? Yes             TIMED UP AND GO:  Was the test performed? No    Cognitive Function:        02/06/2023   12:46 PM  6CIT Screen  What Year? 0 points  What month? 0 points  What time? 0 points  Count back from 20 0 points  Months in reverse 0 points  Repeat phrase 0 points  Total Score 0 points    Immunizations Immunization History  Administered Date(s) Administered   Influenza Split 05/03/2013   Influenza,inj,Quad PF,6+ Mos 04/15/2019   Influenza-Unspecified 04/02/2022   PFIZER(Purple Top)SARS-COV-2 Vaccination 09/20/2019, 10/25/2019, 05/22/2020   Td 09/18/2009   Zoster Recombinant(Shingrix) 08/03/2019, 10/04/2019    TDAP status: Due, Education has been provided regarding the importance of this vaccine. Advised may receive this vaccine at local pharmacy or Health Dept. Aware to provide a copy of the vaccination record if obtained from local pharmacy or Health Dept. Verbalized acceptance and understanding.  Flu Vaccine status: Up to date  Pneumococcal vaccine status: Due, Education has been provided regarding the importance of this vaccine. Advised may receive this vaccine at local pharmacy or Health Dept. Aware to provide a copy of the vaccination record if obtained from local pharmacy or  Health Dept. Verbalized acceptance and understanding.  Covid-19 vaccine status: Completed vaccines  Qualifies for Shingles Vaccine? Yes   Zostavax completed No   Shingrix Completed?: Yes  Screening Tests Health Maintenance  Topic Date Due   DTaP/Tdap/Td (2 - Tdap) 09/19/2019   COVID-19 Vaccine (4 - 2023-24 season) 03/15/2022   Pneumonia Vaccine 91+ Years old (1 of 1 - PCV) 05/03/2023 (Originally 11/23/2021)   Colonoscopy  05/03/2023 (Originally 06/07/2018)   INFLUENZA  VACCINE  02/13/2023   Hepatitis C Screening  Completed   Zoster Vaccines- Shingrix  Completed   HPV VACCINES  Aged Out    Health Maintenance  Health Maintenance Due  Topic Date Due   DTaP/Tdap/Td (2 - Tdap) 09/19/2019   COVID-19 Vaccine (4 - 2023-24 season) 03/15/2022    Colorectal cancer screening: Type of screening: Colonoscopy. Completed 06/07/2013. Repeat every 10 years  Lung Cancer Screening: (Low Dose CT Chest recommended if Age 71-80 years, 20 pack-year currently smoking OR have quit w/in 15years.) does not qualify.   Lung Cancer Screening Referral: no  Additional Screening:  Hepatitis C Screening: does qualify; Completed 07/30/2016  Vision Screening: Recommended annual ophthalmology exams for early detection of glaucoma and other disorders of the eye. Is the patient up to date with their annual eye exam?  Yes  Who is the provider or what is the name of the office in which the patient attends annual eye exams? [facility] If pt is not established with a provider, would they like to be referred to a provider to establish care? No .   Dental Screening: Recommended annual dental exams for proper oral hygiene  Diabetic Foot Exam: N/A  Community Resource Referral / Chronic Care Management: CRR required this visit?  No   CCM required this visit?  No    Plan:     I have personally reviewed and noted the following in the patient's chart:   Medical and social history Use of alcohol, tobacco or illicit drugs  Current medications and supplements including opioid prescriptions. Patient is not currently taking opioid prescriptions. Functional ability and status Nutritional status Physical activity Advanced directives List of other physicians Hospitalizations, surgeries, and ER visits in previous 12 months Vitals Screenings to include cognitive, depression, and falls Referrals and appointments  In addition, I have reviewed and discussed with patient certain  preventive protocols, quality metrics, and best practice recommendations. A written personalized care plan for preventive services as well as general preventive health recommendations were provided to patient.     Mickeal Needy, LPN   1/61/0960   After Visit Summary: (Mail) Due to this being a telephonic visit, the after visit summary with patients personalized plan was offered to patient via mail   Nurse Notes: Normal cognitive status assessed by direct observation via telephone conversation by this Nurse Health Advisor. No abnormalities found.

## 2023-02-10 ENCOUNTER — Ambulatory Visit: Payer: BC Managed Care – PPO | Admitting: Cardiovascular Disease

## 2023-03-03 DIAGNOSIS — C44111 Basal cell carcinoma of skin of unspecified eyelid, including canthus: Secondary | ICD-10-CM | POA: Diagnosis not present

## 2023-03-06 ENCOUNTER — Encounter: Payer: Self-pay | Admitting: Internal Medicine

## 2023-03-13 DIAGNOSIS — I10 Essential (primary) hypertension: Secondary | ICD-10-CM | POA: Diagnosis not present

## 2023-03-13 DIAGNOSIS — G4719 Other hypersomnia: Secondary | ICD-10-CM | POA: Diagnosis not present

## 2023-03-13 DIAGNOSIS — I4891 Unspecified atrial fibrillation: Secondary | ICD-10-CM | POA: Diagnosis not present

## 2023-03-31 ENCOUNTER — Encounter: Payer: Self-pay | Admitting: Cardiology

## 2023-03-31 ENCOUNTER — Ambulatory Visit: Payer: BC Managed Care – PPO | Attending: Cardiology | Admitting: Cardiology

## 2023-03-31 VITALS — BP 116/76 | HR 68 | Ht 75.0 in | Wt 206.2 lb

## 2023-03-31 DIAGNOSIS — I10 Essential (primary) hypertension: Secondary | ICD-10-CM

## 2023-03-31 DIAGNOSIS — D6869 Other thrombophilia: Secondary | ICD-10-CM | POA: Diagnosis not present

## 2023-03-31 DIAGNOSIS — I480 Paroxysmal atrial fibrillation: Secondary | ICD-10-CM

## 2023-03-31 NOTE — Progress Notes (Signed)
Electrophysiology Office Note:   Date:  03/31/2023  ID:  Dayna Ramus., DOB August 25, 1955, MRN [id number]  Primary Cardiologist: Tonny Bollman, MD Electrophysiologist: Regan Lemming, MD      History of Present Illness:   Paysen Turnquist. is a 67 y.o. male with h/o atrial fibrillation, hypertension, hyperlipidemia, CKD seen today for  for Electrophysiology evaluation of atrial fibrillation at the request of Calton Dach.    He presented to [facility] 12/04/2022 with right-sided abdominal pain, shortness of breath exacerbated when lying flat.  He had partially obstructed pulmonary emboli and was noted to be in atrial fibrillation.  He spontaneously converted to sinus rhythm.  He wore a Zio monitor that showed a 2% atrial fibrillation burden and has fluttering at times when he lays down at night.  Since May, he has felt more tired and fatigued as well as short of breath.  He can tell when he is in atrial fibrillation, feeling significant palpitations.  He would like to avoid further medications, Haydan Mansouri but would prefer a rhythm control strategy.  Review of systems complete and found to be negative unless listed in HPI.   EP Information / Studies Reviewed:    EKG is not ordered today. EKG from 02/03/23 reviewed which showed sinus rhythm, rate 53        Risk Assessment/Calculations:    CHA2DS2-VASc Score = 3   This indicates a 3.2% annual risk of stroke. The patient's score is based upon: CHF History: 0 HTN History: 1 Diabetes History: 0 Stroke History: 0 Vascular Disease History: 1 (aortic atherosclerosis) Age Score: 1 Gender Score: 0             Physical Exam:   VS:  BP 116/76 (BP Location: Left Arm, Patient Position: Sitting, Cuff Size: Large)   Pulse 68   Ht 6\' 3"  (1.905 m)   Wt 206 lb 3.2 oz (93.5 kg)   SpO2 99%   BMI 25.77 kg/m    Wt Readings from Last 3 Encounters:  03/31/23 206 lb 3.2 oz (93.5 kg)  02/06/23 208 lb (94.3 kg)  02/03/23 209  lb 12.8 oz (95.2 kg)     GEN: Well nourished, well developed in no acute distress NECK: No JVD; No carotid bruits CARDIAC: Regular rate and rhythm, no murmurs, rubs, gallops RESPIRATORY:  Clear to auscultation without rales, wheezing or rhonchi  ABDOMEN: Soft, non-tender, non-distended EXTREMITIES:  No edema; No deformity   ASSESSMENT AND PLAN:    1.  Paroxysmal atrial fibrillation: Currently on diltiazem.  He would like to stay in normal rhythm and avoid antiarrhythmic medications.  He does feel somewhat tired and fatigued.  He Dvon Jiles take his diltiazem at night.  Lean Fayson also plan for ablation.  Risk and benefits have been discussed.  He understands the risks and is agreed to the procedure. Discussed with primary cardiology  Risk, benefits, and alternatives to EP study and radiofrequency/pulse field ablation for afib were also discussed in detail today. These risks include but are not limited to stroke, bleeding, vascular damage, tamponade, perforation, damage to the esophagus, lungs, and other structures, pulmonary vein stenosis, worsening renal function, and death. The patient understands these risk and wishes to proceed.  We Tytus Strahle therefore proceed with catheter ablation at the next available time.  Carto, ICE, anesthesia are requested for the procedure.  Loron Weimer also obtain CT PV protocol prior to the procedure to exclude LAA thrombus and further evaluate atrial anatomy.  2.  Secondary hypercoagulable state:  Currently on Eliquis for atrial fibrillation  3.  Hypertension: Currently well-controlled  4.  Hyperlipidemia: Continue statin per primary cardiology  Follow up with Dr. Elberta Fortis as usual post procedure  Signed, Gustave Lindeman Jorja Loa, MD

## 2023-03-31 NOTE — H&P (View-Only) (Signed)
Electrophysiology Office Note:   Date:  03/31/2023  ID:  Mathew Ramus., DOB August 25, 1955, MRN [id number]  Primary Cardiologist: Tonny Bollman, MD Electrophysiologist: Regan Lemming, MD      History of Present Illness:   Mathew Turnquist. is a 67 y.o. male with h/o atrial fibrillation, hypertension, hyperlipidemia, CKD seen today for  for Electrophysiology evaluation of atrial fibrillation at the request of Calton Dach.    He presented to [facility] 12/04/2022 with right-sided abdominal pain, shortness of breath exacerbated when lying flat.  He had partially obstructed pulmonary emboli and was noted to be in atrial fibrillation.  He spontaneously converted to sinus rhythm.  He wore a Zio monitor that showed a 2% atrial fibrillation burden and has fluttering at times when he lays down at night.  Since May, he has felt more tired and fatigued as well as short of breath.  He can tell when he is in atrial fibrillation, feeling significant palpitations.  He would like to avoid further medications, Haydan Mansouri but would prefer a rhythm control strategy.  Review of systems complete and found to be negative unless listed in HPI.   EP Information / Studies Reviewed:    EKG is not ordered today. EKG from 02/03/23 reviewed which showed sinus rhythm, rate 53        Risk Assessment/Calculations:    CHA2DS2-VASc Score = 3   This indicates a 3.2% annual risk of stroke. The patient's score is based upon: CHF History: 0 HTN History: 1 Diabetes History: 0 Stroke History: 0 Vascular Disease History: 1 (aortic atherosclerosis) Age Score: 1 Gender Score: 0             Physical Exam:   VS:  BP 116/76 (BP Location: Left Arm, Patient Position: Sitting, Cuff Size: Large)   Pulse 68   Ht 6\' 3"  (1.905 m)   Wt 206 lb 3.2 oz (93.5 kg)   SpO2 99%   BMI 25.77 kg/m    Wt Readings from Last 3 Encounters:  03/31/23 206 lb 3.2 oz (93.5 kg)  02/06/23 208 lb (94.3 kg)  02/03/23 209  lb 12.8 oz (95.2 kg)     GEN: Well nourished, well developed in no acute distress NECK: No JVD; No carotid bruits CARDIAC: Regular rate and rhythm, no murmurs, rubs, gallops RESPIRATORY:  Clear to auscultation without rales, wheezing or rhonchi  ABDOMEN: Soft, non-tender, non-distended EXTREMITIES:  No edema; No deformity   ASSESSMENT AND PLAN:    1.  Paroxysmal atrial fibrillation: Currently on diltiazem.  He would like to stay in normal rhythm and avoid antiarrhythmic medications.  He does feel somewhat tired and fatigued.  He Dvon Jiles take his diltiazem at night.  Lean Fayson also plan for ablation.  Risk and benefits have been discussed.  He understands the risks and is agreed to the procedure. Discussed with primary cardiology  Risk, benefits, and alternatives to EP study and radiofrequency/pulse field ablation for afib were also discussed in detail today. These risks include but are not limited to stroke, bleeding, vascular damage, tamponade, perforation, damage to the esophagus, lungs, and other structures, pulmonary vein stenosis, worsening renal function, and death. The patient understands these risk and wishes to proceed.  We Tytus Strahle therefore proceed with catheter ablation at the next available time.  Carto, ICE, anesthesia are requested for the procedure.  Loron Weimer also obtain CT PV protocol prior to the procedure to exclude LAA thrombus and further evaluate atrial anatomy.  2.  Secondary hypercoagulable state:  Currently on Eliquis for atrial fibrillation  3.  Hypertension: Currently well-controlled  4.  Hyperlipidemia: Continue statin per primary cardiology  Follow up with Dr. Elberta Fortis as usual post procedure  Signed, Gustave Lindeman Jorja Loa, MD

## 2023-03-31 NOTE — Addendum Note (Signed)
Addended by: Baird Lyons on: 03/31/2023 09:11 AM   Modules accepted: Orders

## 2023-03-31 NOTE — Patient Instructions (Signed)
Medication Instructions:  Your physician recommends that you continue on your current medications as directed. Please refer to the Current Medication list given to you today.  *If you need a refill on your cardiac medications before your next appointment, please call your pharmacy*   Lab Work: Pre procedure labs -- see procedure instruction letter:  BMP & CBC  If you have any lab test that is abnormal and we need to change your treatment, we will call you to review the results -- otherwise no news is good news.    Testing/Procedures: Your physician has requested that you have cardiac CT within 7 days PRIOR to your ablation. Cardiac computed tomography (CT) is a painless test that uses an x-ray machine to take clear, detailed pictures of your heart.  Please follow instruction below located under "other instructions". You will get a call from our office to schedule the date for this test.  Your physician has recommended that you have an ablation. Catheter ablation is a medical procedure used to treat some cardiac arrhythmias (irregular heartbeats). During catheter ablation, a long, thin, flexible tube is put into a blood vessel in your groin (upper thigh), or neck. This tube is called an ablation catheter. It is then guided to your heart through the blood vessel. Radio frequency waves destroy small areas of heart tissue where abnormal heartbeats may cause an arrhythmia to start.   You will be scheduled for 07/27/2022.  The EP scheduler, April, will be in touch with CT & procedure instructions.              (Other date options are 1/29 or 1/30)   Follow-Up: At [facility], you and your health needs are our priority.  As part of our continuing mission to provide you with exceptional heart care, we have created designated Provider Care Teams.  These Care Teams include your primary Cardiologist (physician) and Advanced Practice Providers (APPs -  Physician Assistants and Nurse Practitioners) who  all work together to provide you with the care you need, when you need it.  We recommend signing up for the patient portal called "MyChart".  Sign up information is provided on this After Visit Summary.  MyChart is used to connect with patients for Virtual Visits (Telemedicine).  Patients are able to view lab/test results, encounter notes, upcoming appointments, etc.  Non-urgent messages can be sent to your provider as well.   To learn more about what you can do with MyChart, go to [email or web address].    Your next appointment:   1 month(s) after your ablation  The format for your next appointment:   In Person  Provider:   AFib clinic   Thank you for choosing [facility]!!   Dory Horn, RN [phone]    Other Instructions   Cardiac Ablation Cardiac ablation is a procedure to destroy (ablate) some heart tissue that is sending bad signals. These bad signals cause problems in heart rhythm. The heart has many areas that make these signals. If there are problems in these areas, they can make the heart beat in a way that is not normal. Destroying some tissues can help make the heart rhythm normal. Tell your doctor about: Any allergies you have. All medicines you are taking. These include vitamins, herbs, eye drops, creams, and over-the-counter medicines. Any problems you or family members have had with medicines that make you fall asleep (anesthetics). Any blood disorders you have. Any surgeries you have had. Any medical conditions you have, such as kidney  failure. Whether you are pregnant or may be pregnant. What are the risks? This is a safe procedure. But problems may occur, including: Infection. Bruising and bleeding. Bleeding into the chest. Stroke or blood clots. Damage to nearby areas of your body. Allergies to medicines or dyes. The need for a pacemaker if the normal system is damaged. Failure of the procedure to treat the problem. What happens before the  procedure? Medicines Ask your doctor about: Changing or stopping your normal medicines. This is important. Taking aspirin and ibuprofen. Do not take these medicines unless your doctor tells you to take them. Taking other medicines, vitamins, herbs, and supplements. General instructions Follow instructions from your doctor about what you cannot eat or drink. Plan to have someone take you home from the hospital or clinic. If you will be going home right after the procedure, plan to have someone with you for 24 hours. Ask your doctor what steps will be taken to prevent infection. What happens during the procedure?  An IV tube will be put into one of your veins. You will be given a medicine to help you relax. The skin on your neck or groin will be numbed. A cut (incision) will be made in your neck or groin. A needle will be put through your cut and into a large vein. A tube (catheter) will be put into the needle. The tube will be moved to your heart. Dye may be put through the tube. This helps your doctor see your heart. Small devices (electrodes) on the tube will send out signals. A type of energy will be used to destroy some heart tissue. The tube will be taken out. Pressure will be held on your cut. This helps stop bleeding. A bandage will be put over your cut. The exact procedure may vary among doctors and hospitals. What happens after the procedure? You will be watched until you leave the hospital or clinic. This includes checking your heart rate, breathing rate, oxygen, and blood pressure. Your cut will be watched for bleeding. You will need to lie still for a few hours. Do not drive for 24 hours or as long as your doctor tells you. Summary Cardiac ablation is a procedure to destroy some heart tissue. This is done to treat heart rhythm problems. Tell your doctor about any medical conditions you may have. Tell him or her about all medicines you are taking to treat them. This is a  safe procedure. But problems may occur. These include infection, bruising, bleeding, and damage to nearby areas of your body. Follow what your doctor tells you about food and drink. You may also be told to change or stop some of your medicines. After the procedure, do not drive for 24 hours or as long as your doctor tells you. This information is not intended to replace advice given to you by your health care provider. Make sure you discuss any questions you have with your health care provider. Document Revised: 09/21/2021 Document Reviewed: 06/03/2019 Elsevier Patient Education  2023 [employer]   Cardiac Ablation, Care After  This sheet gives you information about how to care for yourself after your procedure. Your health care provider may also give you more specific instructions. If you have problems or questions, contact your health care provider. What can I expect after the procedure? After the procedure, it is common to have: Bruising around your puncture site. Tenderness around your puncture site. Skipped heartbeats. If you had an atrial fibrillation ablation, you may have  atrial fibrillation during the first several months after your procedure.  Tiredness (fatigue).  Follow these instructions at home: Puncture site care  Follow instructions from your health care provider about how to take care of your puncture site. Make sure you: If present, leave stitches (sutures), skin glue, or adhesive strips in place. These skin closures may need to stay in place for up to 2 weeks. If adhesive strip edges start to loosen and curl up, you may trim the loose edges. Do not remove adhesive strips completely unless your health care provider tells you to do that. If a large square bandage is present, this may be removed 24 hours after surgery.  Check your puncture site every day for signs of infection. Check for: Redness, swelling, or pain. Fluid or blood. If your puncture site starts to bleed,  lie down on your back, apply firm pressure to the area, and contact your health care provider. Warmth. Pus or a bad smell. A pea or small marble sized lump at the site is normal and can take up to three months to resolve.  Driving Do not drive for at least 4 days after your procedure or however long your health care provider recommends. (Do not resume driving if you have previously been instructed not to drive for other health reasons.) Do not drive or use heavy machinery while taking prescription pain medicine. Activity Avoid activities that take a lot of effort for at least 7 days after your procedure. Do not lift anything that is heavier than 5 lb (4.5 kg) for one week.  No sexual activity for 1 week.  Return to your normal activities as told by your health care provider. Ask your health care provider what activities are safe for you. General instructions Take over-the-counter and prescription medicines only as told by your health care provider. Do not use any products that contain nicotine or tobacco, such as cigarettes and e-cigarettes. If you need help quitting, ask your health care provider. You may shower after 24 hours, but Do not take baths, swim, or use a hot tub for 1 week.  Do not drink alcohol for 24 hours after your procedure. Keep all follow-up visits as told by your health care provider. This is important. Contact a health care provider if: You have redness, mild swelling, or pain around your puncture site. You have fluid or blood coming from your puncture site that stops after applying firm pressure to the area. Your puncture site feels warm to the touch. You have pus or a bad smell coming from your puncture site. You have a fever. You have chest pain or discomfort that spreads to your neck, jaw, or arm. You have chest pain that is worse with lying on your back or taking a deep breath. You are sweating a lot. You feel nauseous. You have a fast or irregular  heartbeat. You have shortness of breath. You are dizzy or light-headed and feel the need to lie down. You have pain or numbness in the arm or leg closest to your puncture site. Get help right away if: Your puncture site suddenly swells. Your puncture site is bleeding and the bleeding does not stop after applying firm pressure to the area. These symptoms may represent a serious problem that is an emergency. Do not wait to see if the symptoms will go away. Get medical help right away. Call your local emergency services (911 in the [place] Do not drive yourself to the hospital. Summary After the procedure, it  is normal to have bruising and tenderness at the puncture site in your groin, neck, or forearm. Check your puncture site every day for signs of infection. Get help right away if your puncture site is bleeding and the bleeding does not stop after applying firm pressure to the area. This is a medical emergency. This information is not intended to replace advice given to you by your health care provider. Make sure you discuss any questions you have with your health care provider.

## 2023-04-08 ENCOUNTER — Encounter: Payer: Self-pay | Admitting: Cardiovascular Disease

## 2023-04-08 DIAGNOSIS — I480 Paroxysmal atrial fibrillation: Secondary | ICD-10-CM | POA: Diagnosis not present

## 2023-04-08 DIAGNOSIS — G4734 Idiopathic sleep related nonobstructive alveolar hypoventilation: Secondary | ICD-10-CM | POA: Diagnosis not present

## 2023-04-08 DIAGNOSIS — G4733 Obstructive sleep apnea (adult) (pediatric): Secondary | ICD-10-CM | POA: Diagnosis not present

## 2023-04-09 ENCOUNTER — Telehealth: Payer: Self-pay

## 2023-04-09 ENCOUNTER — Encounter: Payer: Self-pay | Admitting: Cardiology

## 2023-04-09 DIAGNOSIS — I480 Paroxysmal atrial fibrillation: Secondary | ICD-10-CM

## 2023-04-09 NOTE — Telephone Encounter (Signed)
Dory Horn, RN called pt to offer him sooner Ablation date. He has been moved up from 07/28/23 to 04/17/23. The call was placed at 5pm and we couldn't get pre-cert on the phone to pre-cert a CT for the next day so Dr. Elberta Fortis reviewed his chart and stated that since he has not missed a dose of Eliquis he would be ok to not have the CT prior to his Ablation.   He will come to [facility] office for labs on 9/30. Instruction letter will be sent via MyChart once completed.

## 2023-04-14 ENCOUNTER — Ambulatory Visit: Payer: BC Managed Care – PPO | Attending: Cardiology

## 2023-04-14 DIAGNOSIS — I480 Paroxysmal atrial fibrillation: Secondary | ICD-10-CM | POA: Diagnosis not present

## 2023-04-14 LAB — CBC

## 2023-04-15 LAB — CBC
Hematocrit: 44 % (ref 37.5–51.0)
Hemoglobin: 14.4 g/dL (ref 13.0–17.7)
MCH: 31.6 pg (ref 26.6–33.0)
MCHC: 32.7 g/dL (ref 31.5–35.7)
MCV: 97 fL (ref 79–97)
Platelets: 187 10*3/uL (ref 150–450)
RBC: 4.55 x10E6/uL (ref 4.14–5.80)
RDW: 12.4 % (ref 11.6–15.4)
WBC: 5.1 10*3/uL (ref 3.4–10.8)

## 2023-04-15 LAB — BASIC METABOLIC PANEL
BUN/Creatinine Ratio: 16 (ref 10–24)
BUN: 24 mg/dL (ref 8–27)
CO2: 23 mmol/L (ref 20–29)
Calcium: 9.2 mg/dL (ref 8.6–10.2)
Chloride: 105 mmol/L (ref 96–106)
Creatinine, Ser: 1.49 mg/dL — ABNORMAL HIGH (ref 0.76–1.27)
Glucose: 88 mg/dL (ref 70–99)
Potassium: 4.4 mmol/L (ref 3.5–5.2)
Sodium: 141 mmol/L (ref 134–144)
eGFR: 51 mL/min/{1.73_m2} — ABNORMAL LOW (ref >59)

## 2023-04-16 NOTE — Pre-Procedure Instructions (Signed)
Instructed patient on the following items: Arrival time 0515 Nothing to eat or drink after midnight No meds AM of procedure Responsible person to drive you home and stay with you for 24 hrs  Have you missed any doses of anti-coagulant Eliquis- takes twice a day, hasn't missed any doses.  Don't take in the morning.

## 2023-04-17 ENCOUNTER — Ambulatory Visit (HOSPITAL_COMMUNITY)
Admission: RE | Admit: 2023-04-17 | Discharge: 2023-04-17 | Disposition: A | Discharge status: Home / Self Care | Payer: BC Managed Care – PPO | Attending: Cardiology | Admitting: Cardiology

## 2023-04-17 ENCOUNTER — Encounter (HOSPITAL_COMMUNITY)
Admission: RE | Disposition: A | Discharge status: Home / Self Care | Payer: Self-pay | Source: Home / Self Care | Attending: Cardiology

## 2023-04-17 ENCOUNTER — Ambulatory Visit (HOSPITAL_COMMUNITY): Payer: BC Managed Care – PPO

## 2023-04-17 ENCOUNTER — Other Ambulatory Visit: Payer: Self-pay

## 2023-04-17 DIAGNOSIS — N189 Chronic kidney disease, unspecified: Secondary | ICD-10-CM | POA: Diagnosis not present

## 2023-04-17 DIAGNOSIS — Z79899 Other long term (current) drug therapy: Secondary | ICD-10-CM | POA: Diagnosis not present

## 2023-04-17 DIAGNOSIS — I480 Paroxysmal atrial fibrillation: Secondary | ICD-10-CM | POA: Diagnosis not present

## 2023-04-17 DIAGNOSIS — D6869 Other thrombophilia: Secondary | ICD-10-CM | POA: Diagnosis not present

## 2023-04-17 DIAGNOSIS — E785 Hyperlipidemia, unspecified: Secondary | ICD-10-CM | POA: Insufficient documentation

## 2023-04-17 DIAGNOSIS — I4892 Unspecified atrial flutter: Secondary | ICD-10-CM | POA: Insufficient documentation

## 2023-04-17 DIAGNOSIS — Z7901 Long term (current) use of anticoagulants: Secondary | ICD-10-CM | POA: Insufficient documentation

## 2023-04-17 DIAGNOSIS — I483 Typical atrial flutter: Secondary | ICD-10-CM

## 2023-04-17 DIAGNOSIS — I129 Hypertensive chronic kidney disease with stage 1 through stage 4 chronic kidney disease, or unspecified chronic kidney disease: Secondary | ICD-10-CM | POA: Insufficient documentation

## 2023-04-17 DIAGNOSIS — I10 Essential (primary) hypertension: Secondary | ICD-10-CM | POA: Diagnosis not present

## 2023-04-17 HISTORY — PX: ATRIAL FIBRILLATION ABLATION: EP1191

## 2023-04-17 LAB — POCT ACTIVATED CLOTTING TIME: Activated Clotting Time: 269 s

## 2023-04-17 SURGERY — ATRIAL FIBRILLATION ABLATION
Anesthesia: General

## 2023-04-17 MED ORDER — AMISULPRIDE (ANTIEMETIC) 5 MG/2ML IV SOLN: 10.0000 mg | Freq: Once | INTRAVENOUS | Status: DC | PRN

## 2023-04-17 MED ORDER — ROCURONIUM BROMIDE 10 MG/ML (PF) SYRINGE
PREFILLED_SYRINGE | INTRAVENOUS | Status: DC | PRN
  Administered 2023-04-17: 60 mg via INTRAVENOUS
  Administered 2023-04-17 (×2): 20 mg via INTRAVENOUS

## 2023-04-17 MED ORDER — ACETAMINOPHEN 325 MG PO TABS: 650.0000 mg | ORAL_TABLET | ORAL | Status: DC | PRN

## 2023-04-17 MED ORDER — PROPOFOL 500 MG/50ML IV EMUL
INTRAVENOUS | Status: DC | PRN
Start: 2023-04-17 — End: 2023-04-17
  Administered 2023-04-17: 55 ug/kg/min via INTRAVENOUS

## 2023-04-17 MED ORDER — PROTAMINE SULFATE 10 MG/ML IV SOLN
INTRAVENOUS | Status: DC | PRN
Start: 2023-04-17 — End: 2023-04-17
  Administered 2023-04-17: 40 mg via INTRAVENOUS

## 2023-04-17 MED ORDER — ONDANSETRON HCL 4 MG/2ML IJ SOLN
INTRAMUSCULAR | Status: DC | PRN
Start: 2023-04-17 — End: 2023-04-17
  Administered 2023-04-17: 4 mg via INTRAVENOUS

## 2023-04-17 MED ORDER — HEPARIN SODIUM (PORCINE) 1000 UNIT/ML IJ SOLN
INTRAMUSCULAR | Status: DC | PRN
Start: 2023-04-17 — End: 2023-04-17
  Administered 2023-04-17: 14000 [IU] via INTRAVENOUS
  Administered 2023-04-17: 6000 [IU] via INTRAVENOUS

## 2023-04-17 MED ORDER — SODIUM CHLORIDE 0.9% FLUSH: 3.0000 mL | INTRAVENOUS | Status: DC | PRN

## 2023-04-17 MED ORDER — FENTANYL CITRATE (PF) 250 MCG/5ML IJ SOLN
INTRAMUSCULAR | Status: DC | PRN
  Administered 2023-04-17 (×4): 50 ug via INTRAVENOUS

## 2023-04-17 MED ORDER — ONDANSETRON HCL 4 MG/2ML IJ SOLN: 4.0000 mg | Freq: Four times a day (QID) | INTRAMUSCULAR | Status: DC | PRN

## 2023-04-17 MED ORDER — PROPOFOL 10 MG/ML IV BOLUS
INTRAVENOUS | Status: DC | PRN
  Administered 2023-04-17: 50 mg via INTRAVENOUS
  Administered 2023-04-17 (×2): 100 mg via INTRAVENOUS
  Administered 2023-04-17: 50 mg via INTRAVENOUS
  Administered 2023-04-17: 100 mg via INTRAVENOUS

## 2023-04-17 MED ORDER — DEXAMETHASONE SODIUM PHOSPHATE 10 MG/ML IJ SOLN
INTRAMUSCULAR | Status: DC | PRN
  Administered 2023-04-17: 10 mg via INTRAVENOUS

## 2023-04-17 MED ORDER — FENTANYL CITRATE (PF) 100 MCG/2ML IJ SOLN: 25.0000 ug | INTRAMUSCULAR | Status: DC | PRN

## 2023-04-17 MED ORDER — LIDOCAINE 2% (20 MG/ML) 5 ML SYRINGE
INTRAMUSCULAR | Status: DC | PRN
  Administered 2023-04-17: 60 mg via INTRAVENOUS

## 2023-04-17 MED ORDER — SUGAMMADEX SODIUM 200 MG/2ML IV SOLN
INTRAVENOUS | Status: DC | PRN
  Administered 2023-04-17: 200 mg via INTRAVENOUS

## 2023-04-17 MED ORDER — ACETAMINOPHEN 500 MG PO TABS
1000.0000 mg | ORAL_TABLET | Freq: Once | ORAL | Status: AC
  Administered 2023-04-17: 1000 mg via ORAL
  Filled 2023-04-17: qty 2

## 2023-04-17 MED ORDER — DOBUTAMINE INFUSION FOR EP/ECHO/NUC (1000 MCG/ML)
INTRAVENOUS | Status: DC | PRN
Start: 2023-04-17 — End: 2023-04-17
  Administered 2023-04-17: 20 ug/kg/min via INTRAVENOUS

## 2023-04-17 MED ORDER — SODIUM CHLORIDE 0.9 % IV SOLN: INTRAVENOUS | Status: DC

## 2023-04-17 MED ORDER — SODIUM CHLORIDE 0.9 % IV SOLN: 250.0000 mL | INTRAVENOUS | Status: DC | PRN

## 2023-04-17 MED ORDER — SODIUM CHLORIDE 0.9% FLUSH: 3.0000 mL | Freq: Two times a day (BID) | INTRAVENOUS | Status: DC

## 2023-04-17 MED ORDER — DEXMEDETOMIDINE HCL IN NACL 80 MCG/20ML IV SOLN
INTRAVENOUS | Status: DC | PRN
Start: 2023-04-17 — End: 2023-04-17
  Administered 2023-04-17: 12 ug via INTRAVENOUS

## 2023-04-17 MED ORDER — HEPARIN (PORCINE) IN NACL 1000-0.9 UT/500ML-% IV SOLN
INTRAVENOUS | Status: DC | PRN
  Administered 2023-04-17 (×4): 500 mL

## 2023-04-17 MED ORDER — HEPARIN SODIUM (PORCINE) 1000 UNIT/ML IJ SOLN
INTRAMUSCULAR | Status: DC | PRN
  Administered 2023-04-17: 1000 [IU] via INTRAVENOUS

## 2023-04-17 MED ORDER — DOBUTAMINE INFUSION FOR EP/ECHO/NUC (1000 MCG/ML)
INTRAVENOUS | Status: AC
  Filled 2023-04-17: qty 250

## 2023-04-17 SURGICAL SUPPLY — 21 items
BAG SNAP BAND KOVER 36X36 (MISCELLANEOUS) IMPLANT
BLANKET WARM UNDERBOD FULL ACC (MISCELLANEOUS) ×1 IMPLANT
CATH ABLAT QDOT MICRO BI TC DF (CATHETERS) IMPLANT
CATH OCTARAY 2.0 F 3-3-3-3-3 (CATHETERS) IMPLANT
CATH S-M CIRCA TEMP PROBE (CATHETERS) IMPLANT
CATH SOUNDSTAR ECO 8FR (CATHETERS) IMPLANT
CATH WEBSTER BI DIR CS D-F CRV (CATHETERS) IMPLANT
CLOSURE MYNX CONTROL 6F/7F (Vascular Products) IMPLANT
CLOSURE PERCLOSE PROSTYLE (VASCULAR PRODUCTS) IMPLANT
COVER SWIFTLINK CONNECTOR (BAG) ×1 IMPLANT
PACK EP LATEX FREE (CUSTOM PROCEDURE TRAY) ×1
PACK EP LF (CUSTOM PROCEDURE TRAY) ×1 IMPLANT
PAD DEFIB RADIO PHYSIO CONN (PAD) ×1 IMPLANT
PATCH CARTO3 (PAD) IMPLANT
SHEATH CARTO VIZIGO SM CVD (SHEATH) IMPLANT
SHEATH PINNACLE 7F 10CM (SHEATH) IMPLANT
SHEATH PINNACLE 8F 10CM (SHEATH) IMPLANT
SHEATH PINNACLE 9F 10CM (SHEATH) IMPLANT
SHEATH PROBE COVER 6X72 (BAG) IMPLANT
SHEATH WIRE KIT BAYLIS SL1 (KITS) IMPLANT
TUBING SMART ABLATE COOLFLOW (TUBING) IMPLANT

## 2023-04-17 NOTE — Anesthesia Postprocedure Evaluation (Signed)
Anesthesia Post Note  Patient: Mathew Sanchez.  Procedure(s) Performed: ATRIAL FIBRILLATION ABLATION     Patient location during evaluation: PACU Anesthesia Type: General Level of consciousness: awake and alert Pain management: pain level controlled Vital Signs Assessment: post-procedure vital signs reviewed and stable Respiratory status: spontaneous breathing, nonlabored ventilation, respiratory function stable and patient connected to nasal cannula oxygen Cardiovascular status: blood pressure returned to baseline and stable Postop Assessment: no apparent nausea or vomiting Anesthetic complications: no  There were no known notable events for this encounter.  Last Vitals:  Vitals:   04/17/23 1345 04/17/23 1400  BP: (!) 158/94 (!) 156/96  Pulse: 61 66  Resp: 13 (!) 0  Temp:    SpO2: 98% 97%    Last Pain:  Vitals:   04/17/23 1400  TempSrc:   PainSc: 0-No pain                 Kennieth Rad

## 2023-04-17 NOTE — Anesthesia Preprocedure Evaluation (Signed)
Anesthesia Evaluation  Patient identified by MRN, date of birth, ID band Patient awake    Reviewed: Allergy & Precautions, NPO status , Patient's Chart, lab work & pertinent test results  Airway Mallampati: II  TM Distance: >3 FB Neck ROM: Full    Dental  (+) Dental Advisory Given   Pulmonary neg pulmonary ROS   breath sounds clear to auscultation       Cardiovascular hypertension, Pt. on medications + dysrhythmias Atrial Fibrillation  Rhythm:Regular Rate:Normal     Neuro/Psych negative neurological ROS     GI/Hepatic negative GI ROS, Neg liver ROS,,,  Endo/Other  negative endocrine ROS    Renal/GU Renal disease     Musculoskeletal   Abdominal   Peds  Hematology negative hematology ROS (+)   Anesthesia Other Findings   Reproductive/Obstetrics                             Anesthesia Physical Anesthesia Plan  ASA: 2  Anesthesia Plan: General   Post-op Pain Management: Tylenol PO (pre-op)*   Induction: Intravenous  PONV Risk Score and Plan: 2 and Dexamethasone, Ondansetron and Treatment may vary due to age or medical condition  Airway Management Planned: Oral ETT  Additional Equipment:   Intra-op Plan:   Post-operative Plan: Extubation in OR  Informed Consent: I have reviewed the patients History and Physical, chart, labs and discussed the procedure including the risks, benefits and alternatives for the proposed anesthesia with the patient or authorized representative who has indicated his/her understanding and acceptance.     Dental advisory given  Plan Discussed with: CRNA  Anesthesia Plan Comments:        Anesthesia Quick Evaluation

## 2023-04-17 NOTE — Interval H&P Note (Signed)
History and Physical Interval Note:  04/17/2023 7:12 AM  Mathew Sanchez.  has presented today for surgery, with the diagnosis of afib.  The various methods of treatment have been discussed with the patient and family. After consideration of risks, benefits and other options for treatment, the patient has consented to  Procedure(s): ATRIAL FIBRILLATION ABLATION (N/A) as a surgical intervention.  The patient's history has been reviewed, patient examined, no change in status, stable for surgery.  I have reviewed the patient's chart and labs.  Questions were answered to the patient's satisfaction.     Milicent Acheampong [employer]

## 2023-04-17 NOTE — Anesthesia Procedure Notes (Signed)
Procedure Name: Intubation Date/Time: 04/17/2023 7:48 AM  Performed by: Vena Austria, CRNAPre-anesthesia Checklist: Patient identified, Emergency Drugs available, Suction available, Patient being monitored and Timeout performed Patient Re-evaluated:Patient Re-evaluated prior to induction Oxygen Delivery Method: Circle system utilized Preoxygenation: Pre-oxygenation with 100% oxygen Induction Type: IV induction Ventilation: Mask ventilation without difficulty Laryngoscope Size: Glidescope and 3 Grade View: Grade I Tube type: Oral Tube size: 7.0 mm Number of attempts: 1 Airway Equipment and Method: Stylet Placement Confirmation: ETT inserted through vocal cords under direct vision, positive ETCO2, CO2 detector and breath sounds checked- equal and bilateral Secured at: 22 cm Tube secured with: Tape Dental Injury: Teeth and Oropharynx as per pre-operative assessment

## 2023-04-17 NOTE — Transfer of Care (Signed)
Immediate Anesthesia Transfer of Care Note  Patient: Mathew Sanchez.  Procedure(s) Performed: ATRIAL FIBRILLATION ABLATION  Patient Location: PACU and Cath Lab  Anesthesia Type:General  Level of Consciousness: awake  Airway & Oxygen Therapy: Patient Spontanous Breathing  Post-op Assessment: Report given to RN  Post vital signs: stable  Last Vitals:  Vitals Value Taken Time  BP    Temp    Pulse    Resp    SpO2      Last Pain:  Vitals:   04/17/23 0606  TempSrc: Oral  PainSc: 0-No pain         Complications: There were no known notable events for this encounter.

## 2023-04-17 NOTE — Discharge Instructions (Addendum)

## 2023-04-18 ENCOUNTER — Encounter (HOSPITAL_COMMUNITY): Payer: Self-pay | Admitting: Cardiology

## 2023-04-28 ENCOUNTER — Encounter: Payer: Self-pay | Admitting: Internal Medicine

## 2023-04-28 NOTE — Telephone Encounter (Signed)
Ok sure that will be great - orders are already in place

## 2023-04-30 ENCOUNTER — Other Ambulatory Visit (INDEPENDENT_AMBULATORY_CARE_PROVIDER_SITE_OTHER): Payer: BC Managed Care – PPO

## 2023-04-30 DIAGNOSIS — Z125 Encounter for screening for malignant neoplasm of prostate: Secondary | ICD-10-CM | POA: Diagnosis not present

## 2023-04-30 DIAGNOSIS — E559 Vitamin D deficiency, unspecified: Secondary | ICD-10-CM

## 2023-04-30 DIAGNOSIS — E538 Deficiency of other specified B group vitamins: Secondary | ICD-10-CM | POA: Diagnosis not present

## 2023-04-30 DIAGNOSIS — E7800 Pure hypercholesterolemia, unspecified: Secondary | ICD-10-CM

## 2023-04-30 DIAGNOSIS — R739 Hyperglycemia, unspecified: Secondary | ICD-10-CM | POA: Diagnosis not present

## 2023-04-30 LAB — CBC WITH DIFFERENTIAL/PLATELET
Basophils Absolute: 0 10*3/uL (ref 0.0–0.1)
Basophils Relative: 0.6 % (ref 0.0–3.0)
Eosinophils Absolute: 0.1 10*3/uL (ref 0.0–0.7)
Eosinophils Relative: 1.6 % (ref 0.0–5.0)
HCT: 44.7 % (ref 39.0–52.0)
Hemoglobin: 14.6 g/dL (ref 13.0–17.0)
Lymphocytes Relative: 23.1 % (ref 12.0–46.0)
Lymphs Abs: 1.3 10*3/uL (ref 0.7–4.0)
MCHC: 32.7 g/dL (ref 30.0–36.0)
MCV: 96.8 fL (ref 78.0–100.0)
Monocytes Absolute: 0.4 10*3/uL (ref 0.1–1.0)
Monocytes Relative: 7.6 % (ref 3.0–12.0)
Neutro Abs: 3.9 10*3/uL (ref 1.4–7.7)
Neutrophils Relative %: 67.1 % (ref 43.0–77.0)
Platelets: 209 10*3/uL (ref 150.0–400.0)
RBC: 4.62 Mil/uL (ref 4.22–5.81)
RDW: 13.4 % (ref 11.5–15.5)
WBC: 5.8 10*3/uL (ref 4.0–10.5)

## 2023-04-30 LAB — URINALYSIS, ROUTINE W REFLEX MICROSCOPIC
Bilirubin Urine: NEGATIVE
Hgb urine dipstick: NEGATIVE
Ketones, ur: NEGATIVE
Leukocytes,Ua: NEGATIVE
Nitrite: NEGATIVE
RBC / HPF: NONE SEEN (ref 0–?)
Specific Gravity, Urine: 1.02 (ref 1.000–1.030)
Total Protein, Urine: NEGATIVE
Urine Glucose: NEGATIVE
Urobilinogen, UA: 0.2 (ref 0.0–1.0)
pH: 6 (ref 5.0–8.0)

## 2023-04-30 LAB — LIPID PANEL
Cholesterol: 156 mg/dL (ref 0–200)
HDL: 44.8 mg/dL (ref >39.00)
LDL Cholesterol: 91 mg/dL (ref 0–99)
NonHDL: 110.84
Total CHOL/HDL Ratio: 3
Triglycerides: 97 mg/dL (ref 0.0–149.0)
VLDL: 19.4 mg/dL (ref 0.0–40.0)

## 2023-04-30 LAB — BASIC METABOLIC PANEL
BUN: 20 mg/dL (ref 6–23)
CO2: 29 meq/L (ref 19–32)
Calcium: 9 mg/dL (ref 8.4–10.5)
Chloride: 106 meq/L (ref 96–112)
Creatinine, Ser: 1.42 mg/dL (ref 0.40–1.50)
GFR: 51.52 mL/min — ABNORMAL LOW (ref >60.00)
Glucose, Bld: 89 mg/dL (ref 70–99)
Potassium: 4.2 meq/L (ref 3.5–5.1)
Sodium: 143 meq/L (ref 135–145)

## 2023-04-30 LAB — HEPATIC FUNCTION PANEL
ALT: 14 U/L (ref 0–53)
AST: 15 U/L (ref 0–37)
Albumin: 4.1 g/dL (ref 3.5–5.2)
Alkaline Phosphatase: 75 U/L (ref 39–117)
Bilirubin, Direct: 0.2 mg/dL (ref 0.0–0.3)
Total Bilirubin: 1.2 mg/dL (ref 0.2–1.2)
Total Protein: 6.4 g/dL (ref 6.0–8.3)

## 2023-04-30 LAB — TSH: TSH: 1.85 u[IU]/mL (ref 0.35–5.50)

## 2023-04-30 LAB — VITAMIN D 25 HYDROXY (VIT D DEFICIENCY, FRACTURES): VITD: 21.65 ng/mL — ABNORMAL LOW (ref 30.00–100.00)

## 2023-04-30 LAB — HEMOGLOBIN A1C: Hgb A1c MFr Bld: 5.5 % (ref 4.6–6.5)

## 2023-04-30 LAB — VITAMIN B12: Vitamin B-12: 270 pg/mL (ref 211–911)

## 2023-04-30 LAB — PSA: PSA: 2.08 ng/mL (ref 0.10–4.00)

## 2023-05-05 ENCOUNTER — Encounter: Payer: Self-pay | Admitting: Internal Medicine

## 2023-05-05 ENCOUNTER — Ambulatory Visit (INDEPENDENT_AMBULATORY_CARE_PROVIDER_SITE_OTHER): Payer: BC Managed Care – PPO | Admitting: Internal Medicine

## 2023-05-05 VITALS — BP 126/72 | HR 65 | Temp 98.1°F | Ht 75.0 in | Wt 207.0 lb

## 2023-05-05 DIAGNOSIS — M25551 Pain in right hip: Secondary | ICD-10-CM | POA: Diagnosis not present

## 2023-05-05 DIAGNOSIS — N1831 Chronic kidney disease, stage 3a: Secondary | ICD-10-CM

## 2023-05-05 DIAGNOSIS — Z1211 Encounter for screening for malignant neoplasm of colon: Secondary | ICD-10-CM

## 2023-05-05 DIAGNOSIS — E7800 Pure hypercholesterolemia, unspecified: Secondary | ICD-10-CM | POA: Diagnosis not present

## 2023-05-05 DIAGNOSIS — Z860100 Personal history of colon polyps, unspecified: Secondary | ICD-10-CM

## 2023-05-05 DIAGNOSIS — I10 Essential (primary) hypertension: Secondary | ICD-10-CM | POA: Diagnosis not present

## 2023-05-05 DIAGNOSIS — R739 Hyperglycemia, unspecified: Secondary | ICD-10-CM | POA: Diagnosis not present

## 2023-05-05 DIAGNOSIS — Z0001 Encounter for general adult medical examination with abnormal findings: Secondary | ICD-10-CM

## 2023-05-05 DIAGNOSIS — E559 Vitamin D deficiency, unspecified: Secondary | ICD-10-CM | POA: Diagnosis not present

## 2023-05-05 DIAGNOSIS — R9431 Abnormal electrocardiogram [ECG] [EKG]: Secondary | ICD-10-CM

## 2023-05-05 MED ORDER — REPATHA SURECLICK 140 MG/ML ~~LOC~~ SOAJ: 140.0000 mg | SUBCUTANEOUS | 3 refills | Status: DC

## 2023-05-05 NOTE — Patient Instructions (Addendum)
Ok to stop the lipitor for now  Please take all new medication as prescribed - the Repatha shots  Please take OTC Vitamin D3 at 2000 units per day, indefinitely  Please continue all other medications as before, and refills have been done if requested.  Please have the pharmacy call with any other refills you may need.  Please continue your efforts at being more active, low cholesterol diet, and weight control.  You are otherwise up to date with prevention measures today.  Please keep your appointments with your specialists as you may have planned - cardiology, and sleep apnea testing  You will be contacted regarding the referral for: Cardiac CT score  Please go to the LAB at the blood drawing area for the tests to be done - the Lipid panel in 1 month at the [facility] or [place] lab  You will be contacted by phone if any changes need to be made immediately.  Otherwise, you will receive a letter about your results with an explanation, but please check with MyChart first.  Please make an Appointment to return for your 1 year visit, or sooner if needed, with Lab testing by Appointment as well, to be done about 3-5 days before at the FIRST FLOOR Lab (so this is for TWO appointments - please see the scheduling desk as you leave)

## 2023-05-05 NOTE — Progress Notes (Signed)
Patient ID: Mathew Sanchez., male   DOB: [birth year], 67 y.o.   MRN: [id number]         Chief Complaint:: wellness exam and low vit d, hld, right hip pain, htn, ckd3a       HPI:  Mathew Sanchez. is a 67 y.o. male here for wellness exam; declines immunizations o/w up to date except due for colonoscopy after jan 2025.                          Also planning retirement next yr. Has had an eventful year with acute PE, afib and now ? OSA for eval ongoing.  Has ongoing myalgias on statin and open to change to repatha.  Also with worsening right hip pain mild to mod intermittent in last several months.  Pt denies chest pain, increased sob or doe, wheezing, orthopnea, PND, increased LE swelling, palpitations, dizziness or syncope.   Pt denies polydipsia, polyuria, or new focal neuro s/s.    Pt denies fever, wt loss, night sweats, loss of appetite, or other constitutional symptoms  No overt bleeding on eliquis.     Wt Readings from Last 3 Encounters:  05/05/23 207 lb (93.9 kg)  04/17/23 208 lb (94.3 kg)  03/31/23 206 lb 3.2 oz (93.5 kg)   BP Readings from Last 3 Encounters:  05/05/23 126/72  04/17/23 (!) 151/93  03/31/23 116/76   Immunization History  Administered Date(s) Administered   Fluad Quad(high Dose 65+) 03/30/2023   Influenza Split 05/03/2013   Influenza,inj,Quad PF,6+ Mos 04/15/2019   Influenza-Unspecified 04/01/2022, 04/02/2022   PFIZER(Purple Top)SARS-COV-2 Vaccination 09/20/2019, 10/25/2019, 05/22/2020   Td 09/18/2009   Zoster Recombinant(Shingrix) 08/03/2019, 10/04/2019   Health Maintenance Due  Topic Date Due   Colonoscopy  06/07/2018   DTaP/Tdap/Td (2 - Tdap) 09/19/2019   Pneumonia Vaccine 7+ Years old (1 of 1 - PCV) Never done   COVID-19 Vaccine (4 - 2023-24 season) 03/16/2023      Past Medical History:  Diagnosis Date   ACNE NEC 07/29/2008   ALLERGIC RHINITIS 07/29/2008   Chronic kidney disease    kidney stones   COLONIC POLYPS, HX OF 07/29/2008    ERECTILE DYSFUNCTION 07/29/2008   HYPERLIPIDEMIA 07/29/2008   HYPERTENSION 07/29/2008   NEPHROLITHIASIS, HX OF 07/29/2008   PSA, INCREASED 07/29/2008   Past Surgical History:  Procedure Laterality Date   ATRIAL FIBRILLATION ABLATION N/A 04/17/2023   Procedure: ATRIAL FIBRILLATION ABLATION;  Surgeon: Regan Lemming, MD;  Location: [facility];  Service: Cardiovascular;  Laterality: N/A;   COLONOSCOPY     POLYPECTOMY     TONSILLECTOMY      reports that he has never smoked. He has never used smokeless tobacco. He reports current alcohol use. He reports that he does not use drugs. family history is not on file. No Known Allergies Current Outpatient Medications on File Prior to Visit  Medication Sig Dispense Refill   apixaban (ELIQUIS) 5 MG TABS tablet Take 1 tablet (5 mg total) by mouth 2 (two) times daily. 180 tablet 2   benazepril (LOTENSIN) 20 MG tablet Take 1 tablet (20 mg total) by mouth daily. 90 tablet 2   diltiazem (CARDIZEM CD) 120 MG 24 hr capsule Take 1 capsule (120 mg total) by mouth daily. 90 capsule 2   tetrahydrozoline 0.05 % ophthalmic solution Place 1 drop into both eyes daily as needed (lubrication/ Dry eye). Visine     No current facility-administered medications  on file prior to visit.        ROS:  All others reviewed and negative.  Objective        PE:  BP 126/72 (BP Location: Right Arm, Patient Position: Sitting, Cuff Size: Normal)   Pulse 65   Temp 98.1 F (36.7 C) (Oral)   Ht 6\' 3"  (1.905 m)   Wt 207 lb (93.9 kg)   SpO2 98%   BMI 25.87 kg/m                 Constitutional: Pt appears in NAD               HENT: Head: NCAT.                Right Ear: External ear normal.                 Left Ear: External ear normal.                Eyes: . Pupils are equal, round, and reactive to light. Conjunctivae and EOM are normal               Nose: without d/c or deformity               Neck: Neck supple. Gross normal ROM               Cardiovascular: Normal  rate and regular rhythm.                 Pulmonary/Chest: Effort normal and breath sounds without rales or wheezing.                Abd:  Soft, NT, ND, + BS, no organomegaly               Neurological: Pt is alert. At baseline orientation, motor grossly intact;  right hip with mild pain with internal and ext rotation               Skin: Skin is warm. No rashes, no other new lesions, LE edema - none               Psychiatric: Pt behavior is normal without agitation   Micro: none  Cardiac tracings I have personally interpreted today:  none  Pertinent Radiological findings (summarize): none   Lab Results  Component Value Date   WBC 5.8 04/30/2023   HGB 14.6 04/30/2023   HCT 44.7 04/30/2023   PLT 209.0 04/30/2023   GLUCOSE 89 04/30/2023   CHOL 156 04/30/2023   TRIG 97.0 04/30/2023   HDL 44.80 04/30/2023   LDLDIRECT 144.7 10/09/2010   LDLCALC 91 04/30/2023   ALT 14 04/30/2023   AST 15 04/30/2023   NA 143 04/30/2023   K 4.2 04/30/2023   CL 106 04/30/2023   CREATININE 1.42 04/30/2023   BUN 20 04/30/2023   CO2 29 04/30/2023   TSH 1.85 04/30/2023   PSA 2.08 04/30/2023   INR 1.1 12/04/2022   HGBA1C 5.5 04/30/2023   Assessment/Plan:  Mathew Sanchez. is a 67 y.o. White or Caucasian [1] male with  has a past medical history of ACNE NEC (07/29/2008), ALLERGIC RHINITIS (07/29/2008), Chronic kidney disease, COLONIC POLYPS, HX OF (07/29/2008), ERECTILE DYSFUNCTION (07/29/2008), HYPERLIPIDEMIA (07/29/2008), HYPERTENSION (07/29/2008), NEPHROLITHIASIS, HX OF (07/29/2008), and PSA, INCREASED (07/29/2008).  Encounter for well adult exam with abnormal findings Age and sex appropriate education and counseling updated with regular exercise and diet Referrals for preventative services - for  colonoscopy after jan 2025 Immunizations addressed - declines all Smoking counseling  - none needed Evidence for depression or other mood disorder - none significant Most recent labs reviewed. I have  personally reviewed and have noted: 1) the patient's medical and social history 2) The patient's current medications and supplements 3) The patient's height, weight, and BMI have been recorded in the chart   Vitamin D deficiency Last vitamin D Lab Results  Component Value Date   VD25OH 21.65 (L) 04/30/2023   Low, to start oral replacement   Right hip pain Mild, likely underlying hip djd, declines xray or need for referral at this itme  HLD (hyperlipidemia) With myalgias possibly due to statin - for change to repatha, and f/u lipids in 4 wks, also for card CT score  History of colonic polyps Due for colonoscopy after jan 2025 per pt request  Essential hypertension BP Readings from Last 3 Encounters:  05/05/23 126/72  04/17/23 (!) 151/93  03/31/23 116/76   Stable, pt to continue medical treatment lotensin 20 every day, card cd 120 qd   CKD (chronic kidney disease) stage 3, GFR 30-59 ml/min (HCC) Lab Results  Component Value Date   CREATININE 1.42 04/30/2023   Stable overall, cont to avoid nephrotoxins  Followup: Return in about 1 year (around 05/04/2024).  Oliver Barre, MD 05/09/2023 1:02 PM [facility]

## 2023-05-09 ENCOUNTER — Encounter: Payer: Self-pay | Admitting: Internal Medicine

## 2023-05-09 DIAGNOSIS — M25551 Pain in right hip: Secondary | ICD-10-CM | POA: Insufficient documentation

## 2023-05-09 NOTE — Assessment & Plan Note (Signed)
Lab Results  Component Value Date   CREATININE 1.42 04/30/2023   Stable overall, cont to avoid nephrotoxins

## 2023-05-09 NOTE — Assessment & Plan Note (Signed)
Due for colonoscopy after jan 2025 per pt request

## 2023-05-09 NOTE — Assessment & Plan Note (Addendum)
With myalgias possibly due to statin - for change to repatha, and f/u lipids in 4 wks, also for card CT score

## 2023-05-09 NOTE — Assessment & Plan Note (Signed)
BP Readings from Last 3 Encounters:  05/05/23 126/72  04/17/23 (!) 151/93  03/31/23 116/76   Stable, pt to continue medical treatment lotensin 20 every day, card cd 120 qd

## 2023-05-09 NOTE — Assessment & Plan Note (Signed)
Last vitamin D Lab Results  Component Value Date   VD25OH 21.65 (L) 04/30/2023   Low, to start oral replacement

## 2023-05-09 NOTE — Assessment & Plan Note (Signed)
Mild, likely underlying hip djd, declines xray or need for referral at this [facility]

## 2023-05-09 NOTE — Assessment & Plan Note (Signed)
Age and sex appropriate education and counseling updated with regular exercise and diet Referrals for preventative services - for colonoscopy after jan 2025 Immunizations addressed - declines all Smoking counseling  - none needed Evidence for depression or other mood disorder - none significant Most recent labs reviewed. I have personally reviewed and have noted: 1) the patient's medical and social history 2) The patient's current medications and supplements 3) The patient's height, weight, and BMI have been recorded in the chart

## 2023-05-12 ENCOUNTER — Telehealth: Payer: Self-pay | Admitting: Pharmacist

## 2023-05-12 ENCOUNTER — Other Ambulatory Visit (HOSPITAL_COMMUNITY): Payer: Self-pay

## 2023-05-12 NOTE — Telephone Encounter (Signed)
Pharmacy Patient Advocate Encounter   Received notification from Physician's Office that prior authorization for Repatha SureClick 140MG /ML auto-injectors is required/requested.   Insurance verification completed.   The patient is insured through [facility] .   Per test claim: PA required; PA started via CoverMyMeds. KEY  [id number] . Waiting for clinical questions to populate.

## 2023-05-12 NOTE — Telephone Encounter (Signed)
 Clinical questions answered and PA submitted

## 2023-05-13 ENCOUNTER — Encounter: Payer: Self-pay | Admitting: Internal Medicine

## 2023-05-13 NOTE — Telephone Encounter (Signed)
Pharmacy Patient Advocate Encounter  Received notification from [facility] that Prior Authorization for Repatha Sureclick has been DENIED.  No reason given; No denial letter received via Fax or CMM. It has been requested and will be uploaded to the media tab once received.   PA #/Case ID/Reference #: [id number]

## 2023-05-15 ENCOUNTER — Ambulatory Visit (HOSPITAL_COMMUNITY): Payer: BC Managed Care – PPO | Admitting: Internal Medicine

## 2023-05-16 ENCOUNTER — Ambulatory Visit (HOSPITAL_COMMUNITY)
Admission: RE | Admit: 2023-05-16 | Discharge: 2023-05-16 | Disposition: A | Discharge status: Home / Self Care | Payer: BC Managed Care – PPO | Source: Ambulatory Visit | Attending: Internal Medicine | Admitting: Internal Medicine

## 2023-05-16 VITALS — BP 130/86 | HR 65 | Ht 75.0 in | Wt 206.8 lb

## 2023-05-16 DIAGNOSIS — R9431 Abnormal electrocardiogram [ECG] [EKG]: Secondary | ICD-10-CM | POA: Diagnosis not present

## 2023-05-16 DIAGNOSIS — Z79899 Other long term (current) drug therapy: Secondary | ICD-10-CM | POA: Diagnosis not present

## 2023-05-16 DIAGNOSIS — I4891 Unspecified atrial fibrillation: Secondary | ICD-10-CM | POA: Diagnosis not present

## 2023-05-16 DIAGNOSIS — Z7901 Long term (current) use of anticoagulants: Secondary | ICD-10-CM | POA: Insufficient documentation

## 2023-05-16 DIAGNOSIS — N189 Chronic kidney disease, unspecified: Secondary | ICD-10-CM | POA: Diagnosis not present

## 2023-05-16 DIAGNOSIS — I480 Paroxysmal atrial fibrillation: Secondary | ICD-10-CM | POA: Insufficient documentation

## 2023-05-16 DIAGNOSIS — I129 Hypertensive chronic kidney disease with stage 1 through stage 4 chronic kidney disease, or unspecified chronic kidney disease: Secondary | ICD-10-CM | POA: Insufficient documentation

## 2023-05-16 DIAGNOSIS — D6869 Other thrombophilia: Secondary | ICD-10-CM | POA: Diagnosis not present

## 2023-05-16 NOTE — Progress Notes (Addendum)
Primary Care Physician: Corwin Levins, MD Primary Cardiologist: new to Dr Excell Seltzer Primary Electrophysiologist: Dr. Elberta Fortis Referring Physician: Dr [place]    Mathew H Ashby Jr. is a 67 y.o. male with a history of HTN, HLD, CKD, atrial fibrillation who presents for follow up in the [facility]. He presented to [facility] ED on 5/22 with complaint of right-sided abdominal pain, shortness of breath exacerbated while lying flat. Patient had telehealth visit with urgent care and was advised to seek further evaluation in the ED. CT angiogram chest with multiple partially obstructing pulmonary emboli bilaterally prominently within the segmental and subsegmental branches of the right lower lobe. Also had left lower extremity DVT. ECG on admission showed rapid afib, started on diltiazem drip. He spontaneously converted to NSR during his admission. Patient is on Eliquis for a CHADS2VASC score of 2. He did not have any tachypalpitations when in afib, unsure what was the duration of the episode. He does consume alcohol and he does snore. While admitted, he did have nocturnal desaturations.   Patient wore a Zio monitor which showed 2% afib burden avg rate 97 bpm. He does state that he can sometimes feel "fluttering" when he lays down at night. He is tolerating anticoagulation without bleeding issues.   On f/u 05/16/23, he is currently in NSR. S/p Afib ablation on 04/17/23. No episodes of Afib since ablation. No chest pain, SOB, or trouble swallowing. Leg sites healed without issue. No missed doses of anticoagulant.  Today, he denies symptoms of orthopnea, PND, lower extremity edema, dizziness, presyncope, syncope, snoring, daytime somnolence, bleeding, or neurologic sequela. The patient is tolerating medications without difficulties and is otherwise without complaint today.    Atrial Fibrillation Risk Factors:  he does have symptoms or diagnosis of sleep apnea. he  does not have a history of rheumatic fever. he does have a history of alcohol use. The patient does not have a history of early familial atrial fibrillation or other arrhythmias.   Atrial Fibrillation Management history:  Previous antiarrhythmic drugs: none Previous cardioversions: none Previous ablations: none Anticoagulation history: Eliquis   Past Medical History:  Diagnosis Date   ACNE NEC 07/29/2008   ALLERGIC RHINITIS 07/29/2008   Chronic kidney disease    kidney stones   COLONIC POLYPS, HX OF 07/29/2008   ERECTILE DYSFUNCTION 07/29/2008   HYPERLIPIDEMIA 07/29/2008   HYPERTENSION 07/29/2008   NEPHROLITHIASIS, HX OF 07/29/2008   PSA, INCREASED 07/29/2008    ROS- All systems are reviewed and negative except as per the HPI above.  Physical Exam: Vitals:   05/16/23 0823  BP: 130/86  Pulse: 65  Weight: 93.8 kg  Height: 6\' 3"  (1.905 m)    GEN- The patient is well appearing, alert and oriented x 3 today.   Neck - no JVD or carotid bruit noted Lungs- Clear to ausculation bilaterally, normal work of breathing Heart- Regular rate and rhythm, no murmurs, rubs or gallops, PMI not laterally displaced Extremities- no clubbing, cyanosis, or edema Skin - no rash or ecchymosis noted   Wt Readings from Last 3 Encounters:  05/16/23 93.8 kg  05/05/23 93.9 kg  04/17/23 94.3 kg    EKG today demonstrates  Vent. rate 65 BPM PR interval 152 ms QRS duration 80 ms QT/QTcB 400/416 ms P-R-T axes 51 32 38 Normal sinus rhythm Cannot rule out Anterior infarct , age undetermined Abnormal ECG When compared with ECG of 17-Apr-2023 09:55, PREVIOUS ECG IS PRESENT  Echo 12/05/22 demonstrated  1.  Left ventricular ejection fraction, by estimation, is 55 to 60%. The  left ventricle has normal function. The left ventricle has no regional  wall motion abnormalities. Left ventricular diastolic parameters were  normal.   2. Right ventricular systolic function is normal. The right ventricular   size is normal.   3. The mitral valve is normal in structure. Mild mitral valve  regurgitation. No evidence of mitral stenosis.   4. The aortic valve is normal in structure. Aortic valve regurgitation is  not visualized. No aortic stenosis is present.   5. The inferior vena cava is normal in size with greater than 50%  respiratory variability, suggesting right atrial pressure of 3 mmHg.    Epic records are reviewed at length today.  CHA2DS2-VASc Score = 3  The patient's score is based upon: CHF History: 0 HTN History: 1 Diabetes History: 0 Stroke History: 0 Vascular Disease History: 1 (aortic atherosclerosis) Age Score: 1 Gender Score: 0      ASSESSMENT AND PLAN: Paroxysmal Atrial Fibrillation (ICD10:  I48.0) The patient's CHA2DS2-VASc score is 3, indicating a 3.2% annual risk of stroke.   S/p Afib and flutter ablation on 04/17/23 by Dr. Elberta Fortis.   He is currently in NSR.   Continue diltiazem 120 mg daily Continue Eliquis 5 mg BID  Secondary Hypercoagulable State (ICD10:  D68.69) The patient is at significant risk for stroke/thromboembolism based upon his CHA2DS2-VASc Score of 3.  Continue Apixaban (Eliquis).   HTN Stable on current regimen    Follow up as scheduled with Dr. Elberta Fortis.   Justin Mend, PA-C Afib Clinic [facility] [address] [place], [place] [address] [phone] 05/16/2023 10:22 AM

## 2023-05-26 DIAGNOSIS — G4733 Obstructive sleep apnea (adult) (pediatric): Secondary | ICD-10-CM | POA: Diagnosis not present

## 2023-05-28 DIAGNOSIS — G4733 Obstructive sleep apnea (adult) (pediatric): Secondary | ICD-10-CM | POA: Diagnosis not present

## 2023-05-30 DIAGNOSIS — L570 Actinic keratosis: Secondary | ICD-10-CM | POA: Diagnosis not present

## 2023-05-30 DIAGNOSIS — L821 Other seborrheic keratosis: Secondary | ICD-10-CM | POA: Diagnosis not present

## 2023-05-30 DIAGNOSIS — B070 Plantar wart: Secondary | ICD-10-CM | POA: Diagnosis not present

## 2023-06-17 DIAGNOSIS — G4733 Obstructive sleep apnea (adult) (pediatric): Secondary | ICD-10-CM | POA: Diagnosis not present

## 2023-06-17 DIAGNOSIS — I10 Essential (primary) hypertension: Secondary | ICD-10-CM | POA: Diagnosis not present

## 2023-06-25 ENCOUNTER — Ambulatory Visit: Payer: BC Managed Care – PPO | Admitting: Cardiovascular Disease

## 2023-06-30 ENCOUNTER — Ambulatory Visit: Payer: BC Managed Care – PPO | Admitting: Student

## 2023-07-15 ENCOUNTER — Other Ambulatory Visit (HOSPITAL_COMMUNITY): Payer: BC Managed Care – PPO

## 2023-07-17 DIAGNOSIS — M5416 Radiculopathy, lumbar region: Secondary | ICD-10-CM | POA: Diagnosis not present

## 2023-07-17 DIAGNOSIS — M1611 Unilateral primary osteoarthritis, right hip: Secondary | ICD-10-CM | POA: Diagnosis not present

## 2023-07-18 ENCOUNTER — Ambulatory Visit: Payer: BC Managed Care – PPO | Admitting: Student

## 2023-07-18 DIAGNOSIS — G4733 Obstructive sleep apnea (adult) (pediatric): Secondary | ICD-10-CM | POA: Diagnosis not present

## 2023-07-18 DIAGNOSIS — I10 Essential (primary) hypertension: Secondary | ICD-10-CM | POA: Diagnosis not present

## 2023-07-30 NOTE — Progress Notes (Signed)
Electrophysiology Office Note:   Date:  07/31/2023  ID:  Mathew Ramus., DOB [birth year], MRN [id number]  Primary Cardiologist: Tonny Bollman, MD Primary Heart Failure: None Electrophysiologist: Will Jorja Loa, MD      History of Present Illness:   Mathew Sanchez. is a 68 y.o. male with h/o AF s/p ablation 04/2023, HTN, HLD, DVT, CKD III  seen today for routine electrophysiology followup.   Since last being seen in our clinic the patient reports doing very well. He remains active but not as much as he would like. He plans on retiring next year.  No symptoms of AF since ablation. Occasional cardiac awareness but HR's remain 60-80 per his watch. No bleeding issues on anticoagulation.  He denies chest pain, palpitations, dyspnea, PND, orthopnea, nausea, vomiting, dizziness, syncope, edema, weight gain, or early satiety.   Review of systems complete and found to be negative unless listed in HPI.   EP Information / Studies Reviewed:    EKG is ordered today. Personal review as below.  EKG Interpretation Date/Time:  Thursday July 31 2023 10:57:29 EST Ventricular Rate:  58 PR Interval:  154 QRS Duration:  78 QT Interval:  432 QTC Calculation: 424 R Axis:   36  Text Interpretation: Sinus bradycardia Confirmed by Canary Brim ([id number]) on 07/31/2023 11:01:09 AM   Studies:  ECHO 11/2022 > LVEF 55-60%  Cardiac Monitor 12/2022 > predominant rhythm SR, 5 VT episodes all less than 14 beats, 67 SVT episodes all less than 22 seconds, 2% AF burden   Arrhythmia / AAD AF  AF ablation 04/17/23 > SR on presentation, RFA of all 4 pulmonary veins, CTI ablation as well     Risk Assessment/Calculations:    CHA2DS2-VASc Score = 3   This indicates a 3.2% annual risk of stroke. The patient's score is based upon: CHF History: 0 HTN History: 1 Diabetes History: 0 Stroke History: 0 Vascular Disease History: 1 (aortic atherosclerosis) Age Score: 1 Gender Score: 0              Physical Exam:   VS:  BP 124/82   Pulse (!) 58   Ht 6\' 3"  (1.905 m)   Wt 212 lb 6.4 oz (96.3 kg)   SpO2 95%   BMI 26.55 kg/m    Wt Readings from Last 3 Encounters:  07/31/23 212 lb 6.4 oz (96.3 kg)  05/16/23 206 lb 12.8 oz (93.8 kg)  05/05/23 207 lb (93.9 kg)     GEN: Well nourished, well developed in no acute distress NECK: No JVD; No carotid bruits CARDIAC: Regular rate and rhythm, no murmurs, rubs, gallops RESPIRATORY:  Clear to auscultation without rales, wheezing or rhonchi  ABDOMEN: Soft, non-tender, non-distended EXTREMITIES:  No edema; No deformity   ASSESSMENT AND PLAN:    Paroxysmal Atrial Fibrillation  AFL  CHA2DS2-VASc 3. S/p PVI, CTI ablation 04/2023.  -EKG with SB -no symptom burden per patient    -continue OAC for stroke prophylaxis  -continue cardizem 120mg  daily, refills provided  -he monitors with an Apple Watch   Secondary Hypercoagulable State  -continue Eliquis, dose reviewed and appropriate by age/wt -labs in 04/2023 reviewed.  Consider repeat at next visit if no labs in between.   Hx PE, LLE DVT 11/2022  -on anticoagulation as above   Hypertension  -well controlled on current regimen     Follow up with Dr. Elberta Fortis or EP APP  9 months   Signed, Canary Brim, NP-C, AGACNP-BC [facility] - Electrophysiology  07/31/2023, 11:21 AM

## 2023-07-31 ENCOUNTER — Ambulatory Visit: Payer: BC Managed Care – PPO | Attending: Pulmonary Disease | Admitting: Pulmonary Disease

## 2023-07-31 ENCOUNTER — Encounter: Payer: Self-pay | Admitting: Pulmonary Disease

## 2023-07-31 VITALS — BP 124/82 | HR 58 | Ht 75.0 in | Wt 212.4 lb

## 2023-07-31 DIAGNOSIS — I10 Essential (primary) hypertension: Secondary | ICD-10-CM | POA: Diagnosis not present

## 2023-07-31 DIAGNOSIS — I480 Paroxysmal atrial fibrillation: Secondary | ICD-10-CM | POA: Diagnosis not present

## 2023-07-31 DIAGNOSIS — D6869 Other thrombophilia: Secondary | ICD-10-CM

## 2023-07-31 DIAGNOSIS — I4892 Unspecified atrial flutter: Secondary | ICD-10-CM | POA: Diagnosis not present

## 2023-07-31 NOTE — Patient Instructions (Signed)
Medication Instructions:  Your physician recommends that you continue on your current medications as directed. Please refer to the Current Medication list given to you today.  *If you need a refill on your cardiac medications before your next appointment, please call your pharmacy*  Lab Work: None ordered If you have labs (blood work) drawn today and your tests are completely normal, you will receive your results only by: MyChart Message (if you have MyChart) OR A paper copy in the mail If you have any lab test that is abnormal or we need to change your treatment, we will call you to review the results.  Follow-Up: At [facility], you and your health needs are our priority.  As part of our continuing mission to provide you with exceptional heart care, we have created designated Provider Care Teams.  These Care Teams include your primary Cardiologist (physician) and Advanced Practice Providers (APPs -  Physician Assistants and Nurse Practitioners) who all work together to provide you with the care you need, when you need it.  Your next appointment:   9 month(s)  Provider:   Loman Brooklyn, MD or Canary Brim, NP

## 2023-08-01 ENCOUNTER — Ambulatory Visit: Payer: BC Managed Care – PPO | Admitting: Gastroenterology

## 2023-08-04 ENCOUNTER — Other Ambulatory Visit: Payer: Self-pay | Admitting: Internal Medicine

## 2023-08-04 ENCOUNTER — Other Ambulatory Visit: Payer: Self-pay

## 2023-08-04 MED ORDER — ATORVASTATIN CALCIUM 10 MG PO TABS: 10.0000 mg | ORAL_TABLET | Freq: Every day | ORAL | 3 refills | Status: DC

## 2023-08-18 DIAGNOSIS — I10 Essential (primary) hypertension: Secondary | ICD-10-CM | POA: Diagnosis not present

## 2023-08-18 DIAGNOSIS — G4733 Obstructive sleep apnea (adult) (pediatric): Secondary | ICD-10-CM | POA: Diagnosis not present

## 2023-08-25 DIAGNOSIS — G4733 Obstructive sleep apnea (adult) (pediatric): Secondary | ICD-10-CM | POA: Diagnosis not present

## 2023-09-15 DIAGNOSIS — I10 Essential (primary) hypertension: Secondary | ICD-10-CM | POA: Diagnosis not present

## 2023-09-15 DIAGNOSIS — G4733 Obstructive sleep apnea (adult) (pediatric): Secondary | ICD-10-CM | POA: Diagnosis not present

## 2023-09-26 ENCOUNTER — Encounter: Payer: Self-pay | Admitting: Gastroenterology

## 2023-09-26 ENCOUNTER — Ambulatory Visit: Payer: BC Managed Care – PPO | Admitting: Gastroenterology

## 2023-09-26 VITALS — BP 130/80 | HR 65 | Ht 75.0 in | Wt 211.0 lb

## 2023-09-26 DIAGNOSIS — Z86718 Personal history of other venous thrombosis and embolism: Secondary | ICD-10-CM | POA: Diagnosis not present

## 2023-09-26 DIAGNOSIS — Z86711 Personal history of pulmonary embolism: Secondary | ICD-10-CM

## 2023-09-26 DIAGNOSIS — I480 Paroxysmal atrial fibrillation: Secondary | ICD-10-CM

## 2023-09-26 DIAGNOSIS — Z1211 Encounter for screening for malignant neoplasm of colon: Secondary | ICD-10-CM

## 2023-09-26 NOTE — Patient Instructions (Signed)
 _______________________________________________________  If your blood pressure at your visit was 140/90 or greater, please contact your primary care physician to follow up on this. _______________________________________________________  If you are age 67 or older, your body mass index should be between 23-30. Your Body mass index is 26.37 kg/m. If this is out of the aforementioned range listed, please consider follow up with your Primary Care Provider. ________________________________________________________  The [facility] providers would like to encourage you to use [facility] to communicate with providers for non-urgent requests or questions.  Due to long hold times on the telephone, sending your provider a message by [facility] may be a faster and more efficient way to get a response.  Please allow 48 business hours for a response.  Please remember that this is for non-urgent requests.  _______________________________________________________  It has been recommended to you by your physician that you have a colonoscopy completed. Per your request, we did not schedule the procedure today due to traveling.  We will contact you when our June schedule is available.  Vito Cirigliano, DO

## 2023-09-26 NOTE — Progress Notes (Signed)
 Chief Complaint: Colon cancer screening, medication management   Referring Provider:    Corwin Levins, MD     HPI:     Mathew Sanchez. is a 68 y.o. male with a history of paroxysmal A-fib s/p ablation 04/2023, HTN, HLD, DVT and PE (on Eliquis), CKD 3, referred to the Gastroenterology Clinic for evaluation of colonoscopy for ongoing colon cancer screening and medication management in the perioperative setting.  He is otherwise without any GI symptoms.  No change in bowel habits, hematochezia, melena, abdominal pain.  Good p.o. intake.    Last colonoscopy was 05/2013 and normal with recommendation to repeat in 10 years.  Prior to that, colonoscopy in 09/2007 with erythema and granularity in the sigmoid colon (path), otherwise normal.  Followed regularly in the Cardiology Clinic, last seen 07/31/2023.  No A-fib symptoms since ablation in 04/2023.  Currently taking Eliquis due to history of LLE DVT and PE 11/2022.  Plan is for lifelong anticoagulation.  Reviewed labs from 04/2023: Normal CBC, liver enzymes   Past Medical History:  Diagnosis Date   ACNE NEC 07/29/2008   ALLERGIC RHINITIS 07/29/2008   Chronic kidney disease    kidney stones   COLONIC POLYPS, HX OF 07/29/2008   ERECTILE DYSFUNCTION 07/29/2008   HYPERLIPIDEMIA 07/29/2008   HYPERTENSION 07/29/2008   NEPHROLITHIASIS, HX OF 07/29/2008   PSA, INCREASED 07/29/2008     Past Surgical History:  Procedure Laterality Date   ATRIAL FIBRILLATION ABLATION N/A 04/17/2023   Procedure: ATRIAL FIBRILLATION ABLATION;  Surgeon: Regan Lemming, MD;  Location: [facility];  Service: Cardiovascular;  Laterality: N/A;   COLONOSCOPY     POLYPECTOMY     TONSILLECTOMY     Family History  Problem Relation Age of Onset   Colon cancer Neg Hx    Liver disease Neg Hx    Esophageal cancer Neg Hx    Social History   Tobacco Use   Smoking status: Never   Smokeless tobacco: Never  Vaping Use   Vaping status:  Never Used  Substance Use Topics   Alcohol use: Yes    Comment: socially   Drug use: No   Current Outpatient Medications  Medication Sig Dispense Refill   atorvastatin (LIPITOR) 10 MG tablet Take 1 tablet (10 mg total) by mouth daily. 90 tablet 3   apixaban (ELIQUIS) 5 MG TABS tablet Take 1 tablet (5 mg total) by mouth 2 (two) times daily. 180 tablet 2   benazepril (LOTENSIN) 20 MG tablet Take 1 tablet (20 mg total) by mouth daily. 90 tablet 2   diltiazem (CARDIZEM CD) 120 MG 24 hr capsule Take 1 capsule (120 mg total) by mouth daily. 90 capsule 2   No current facility-administered medications for this visit.   No Known Allergies   Review of Systems: All systems reviewed and negative except where noted in HPI.     Physical Exam:    Wt Readings from Last 3 Encounters:  09/26/23 211 lb (95.7 kg)  07/31/23 212 lb 6.4 oz (96.3 kg)  05/16/23 206 lb 12.8 oz (93.8 kg)    BP 130/80   Pulse 65   Ht 6\' 3"  (1.905 m)   Wt 211 lb (95.7 kg)   BMI 26.37 kg/m  Constitutional:  Pleasant, in no acute distress. Psychiatric: Normal mood and affect. Behavior is normal. Cardiovascular: Normal rate, regular rhythm. No edema Pulmonary/chest: Effort normal and breath sounds normal.  No wheezing, rales or rhonchi. Abdominal: Soft, nondistended, nontender. Bowel sounds active throughout. There are no masses palpable. No hepatomegaly. Neurological: Alert and oriented to person place and time. Skin: Skin is warm and dry. No rashes noted.   ASSESSMENT AND PLAN;   1) Colon cancer screening Due for repeat colonoscopy for ongoing CRC screening. - Schedule colonoscopy  2) History of DVT 3) History of PE 4) A-fib status post ablation - Hold Eliquis 2 days before procedure - will instruct when and how to resume after procedure. Low but real risk of cardiovascular event such as heart attack, stroke, embolism, thrombosis or ischemia/infarct of other organs off this explained and need to seek urgent  help if this occurs. The patient consents to proceed. Will communicate by phone or EMR with patient's prescribing provider to confirm that holding Eliquis is reasonable in this case   The indications, risks, and benefits of colonoscopy were explained to the patient in detail. Risks include but are not limited to bleeding, perforation, adverse reaction to medications, and cardiopulmonary compromise. Sequelae include but are not limited to the possibility of surgery, hospitalization, and mortality. The patient verbalized understanding and wished to proceed. All questions answered, referred to the scheduler and bowel prep ordered. Further recommendations pending results of the exam.    Shellia Cleverly, DO, FACG  09/26/2023, 10:59 AM   Corwin Levins, MD

## 2023-10-13 ENCOUNTER — Telehealth: Payer: Self-pay

## 2023-10-13 DIAGNOSIS — I480 Paroxysmal atrial fibrillation: Secondary | ICD-10-CM

## 2023-10-13 DIAGNOSIS — Z86718 Personal history of other venous thrombosis and embolism: Secondary | ICD-10-CM

## 2023-10-13 DIAGNOSIS — Z86711 Personal history of pulmonary embolism: Secondary | ICD-10-CM

## 2023-10-13 DIAGNOSIS — Z1211 Encounter for screening for malignant neoplasm of colon: Secondary | ICD-10-CM

## 2023-10-13 NOTE — Telephone Encounter (Signed)
 Left message for patient to return call to our office to schedule colonoscopy in June 2025.  Will continue efforts.

## 2023-10-13 NOTE — Telephone Encounter (Signed)
-----   Message from [facility] sent at 09/26/2023 11:19 AM EDT ----- Regarding: June 2025 Needs colon in June.  Needs clearance to hold Eliquis with [facility] hx of DVT and PE, colon cancer screening per [facility]

## 2023-10-15 ENCOUNTER — Telehealth: Payer: Self-pay

## 2023-10-15 MED ORDER — NA SULFATE-K SULFATE-MG SULF 17.5-3.13-1.6 GM/177ML PO SOLN: 1.0000 | ORAL | 0 refills | Status: AC

## 2023-10-15 NOTE — Telephone Encounter (Signed)
 Patient advised that we now have available dates to schedule colonoscopy in June.  Patient scheduled for 12-22-23 at 9:30am.  Patient aware that we will get clearance to hold Eliquis x 2 days prior to the procedure and he will be made aware.  Patient instructions discussed by phone and sent to his MyChart for his review.  Patient agreed to plan and verbalized understanding.  No further questions.

## 2023-10-15 NOTE — Telephone Encounter (Signed)
 [facility] Pre-operative Risk Assessment     Request for surgical clearance:     Endoscopy Procedure  What type of surgery is being performed?     Colonoscopy  When is this surgery scheduled?     12-22-23  What type of clearance is required ?   Pharmacy  Are there any medications that need to be held prior to surgery and how long? Eliquis x 2 days  Practice name and name of physician performing surgery?      [facility]  What is your office phone and fax number?      Phone- [phone]  Fax- [phone]  Anesthesia type (None, local, MAC, general) ?       MAC   Please route your response to Blondell Reveal, CMA

## 2023-10-16 NOTE — Telephone Encounter (Signed)
 Patient with diagnosis of atrial fibrillation on Eliquis for anticoagulation.    What type of surgery is being performed?     Colonoscopy  When is this surgery scheduled?     12-22-23    CHA2DS2-VASc Score = 3   This indicates a 3.2% annual risk of stroke. The patient's score is based upon: CHF History: 0 HTN History: 1 Diabetes History: 0 Stroke History: 0 Vascular Disease History: 1 (aortic atherosclerosis) Age Score: 1 Gender Score: 0   Patient was found to have PE/DVT at time of AF diagnosis - 11/2022  CrCl 69 Platelet count 209  Per office protocol, patient can hold Eliquis for 2 days prior to procedure.   Patient will not need bridging with Lovenox (enoxaparin) around procedure.  **This guidance is not considered finalized until pre-operative APP has relayed final recommendations.**

## 2023-10-16 NOTE — Telephone Encounter (Signed)
   Name: Mathew Sanchez.  DOB: 1955-11-15  MRN: [id number]   Primary Cardiologist: Tonny Bollman, MD  Chart reviewed as part of pre-operative protocol coverage.   Per Pharm D, patient may hold Eliquis for 2 days prior to procedure. Patient will not need bridging with Lovenox (enoxaparin) around procedure.     I will route this recommendation to the requesting party via Epic fax function and remove from pre-op pool. Please call with questions.  Carlos Levering, NP 10/16/2023, 3:00 PM

## 2023-10-16 NOTE — Telephone Encounter (Signed)
 Patient advised that he has been given clearance to hold Eliquis 2 days prior to colonoscopy scheduled for 12-22-23.  Patient advised to take last dose of Eliqus on 12-19-23, and he will be advised when to restart Eliquis by Dr Barron Alvine after the procedure.  Patient agreed to plan and verbalized understanding.  No further questions.

## 2023-10-23 ENCOUNTER — Other Ambulatory Visit (HOSPITAL_COMMUNITY): Payer: Self-pay | Admitting: Physician Assistant

## 2023-10-23 NOTE — Telephone Encounter (Signed)
 This RX was refilled by [facility]ib clinic and they have released this Pt to see Dr. Elberta Fortis. Does Dr. Elberta Fortis want to refill? Please advise

## 2023-10-27 ENCOUNTER — Other Ambulatory Visit: Payer: Self-pay | Admitting: Internal Medicine

## 2023-10-27 DIAGNOSIS — E538 Deficiency of other specified B group vitamins: Secondary | ICD-10-CM

## 2023-10-27 DIAGNOSIS — E785 Hyperlipidemia, unspecified: Secondary | ICD-10-CM

## 2023-10-27 DIAGNOSIS — Z125 Encounter for screening for malignant neoplasm of prostate: Secondary | ICD-10-CM

## 2023-10-27 DIAGNOSIS — E559 Vitamin D deficiency, unspecified: Secondary | ICD-10-CM

## 2023-10-27 DIAGNOSIS — R739 Hyperglycemia, unspecified: Secondary | ICD-10-CM

## 2023-10-27 DIAGNOSIS — E7800 Pure hypercholesterolemia, unspecified: Secondary | ICD-10-CM

## 2023-11-19 ENCOUNTER — Encounter: Payer: Self-pay | Admitting: Internal Medicine

## 2023-11-19 MED ORDER — BENAZEPRIL HCL 20 MG PO TABS: 20.0000 mg | ORAL_TABLET | Freq: Every day | ORAL | 2 refills | Status: DC

## 2023-11-20 ENCOUNTER — Other Ambulatory Visit (HOSPITAL_COMMUNITY): Payer: Self-pay

## 2023-11-20 ENCOUNTER — Other Ambulatory Visit: Payer: Self-pay | Admitting: *Deleted

## 2023-11-20 DIAGNOSIS — I480 Paroxysmal atrial fibrillation: Secondary | ICD-10-CM

## 2023-11-20 MED ORDER — APIXABAN 5 MG PO TABS: 5.0000 mg | ORAL_TABLET | Freq: Two times a day (BID) | ORAL | 1 refills | Status: DC

## 2023-11-20 NOTE — Telephone Encounter (Signed)
 Eliquis  5mg  refill request received. Patient is 68 years old, weight-95.7kg, Crea-1.42 on 04/30/23, Diagnosis-Afib, and last seen by Creighton Doffing on 07/31/23. Dose is appropriate based on dosing criteria. Will send in refill to requested pharmacy.

## 2023-11-26 DIAGNOSIS — D485 Neoplasm of uncertain behavior of skin: Secondary | ICD-10-CM | POA: Diagnosis not present

## 2023-11-26 DIAGNOSIS — C44529 Squamous cell carcinoma of skin of other part of trunk: Secondary | ICD-10-CM | POA: Diagnosis not present

## 2023-11-26 DIAGNOSIS — L570 Actinic keratosis: Secondary | ICD-10-CM | POA: Diagnosis not present

## 2023-11-26 DIAGNOSIS — L814 Other melanin hyperpigmentation: Secondary | ICD-10-CM | POA: Diagnosis not present

## 2023-12-15 ENCOUNTER — Encounter: Payer: Self-pay | Admitting: Gastroenterology

## 2023-12-15 DIAGNOSIS — C44529 Squamous cell carcinoma of skin of other part of trunk: Secondary | ICD-10-CM | POA: Diagnosis not present

## 2023-12-15 MED ORDER — NA SULFATE-K SULFATE-MG SULF 17.5-3.13-1.6 GM/177ML PO SOLN: ORAL | 0 refills | Status: AC

## 2023-12-16 ENCOUNTER — Encounter: Payer: Self-pay | Admitting: Gastroenterology

## 2023-12-22 ENCOUNTER — Ambulatory Visit (AMBULATORY_SURGERY_CENTER): Admitting: Gastroenterology

## 2023-12-22 ENCOUNTER — Encounter: Payer: Self-pay | Admitting: Gastroenterology

## 2023-12-22 VITALS — BP 133/79 | HR 53 | Temp 98.2°F | Resp 11 | Ht 75.0 in | Wt 211.0 lb

## 2023-12-22 DIAGNOSIS — D125 Benign neoplasm of sigmoid colon: Secondary | ICD-10-CM

## 2023-12-22 DIAGNOSIS — D122 Benign neoplasm of ascending colon: Secondary | ICD-10-CM | POA: Diagnosis not present

## 2023-12-22 DIAGNOSIS — Z1211 Encounter for screening for malignant neoplasm of colon: Secondary | ICD-10-CM | POA: Diagnosis not present

## 2023-12-22 DIAGNOSIS — K635 Polyp of colon: Secondary | ICD-10-CM

## 2023-12-22 DIAGNOSIS — D120 Benign neoplasm of cecum: Secondary | ICD-10-CM

## 2023-12-22 DIAGNOSIS — K641 Second degree hemorrhoids: Secondary | ICD-10-CM | POA: Diagnosis not present

## 2023-12-22 MED ORDER — SODIUM CHLORIDE 0.9 % IV SOLN: 500.0000 mL | Freq: Once | INTRAVENOUS | Status: DC

## 2023-12-22 NOTE — Progress Notes (Signed)
 Report given to PACU, vss

## 2023-12-22 NOTE — Progress Notes (Signed)
 GASTROENTEROLOGY PROCEDURE H&P NOTE   Primary Care Physician: Roslyn Coombe, MD    Reason for Procedure:  Colon Cancer screening  Plan:    Colonoscopy  Patient is appropriate for endoscopic procedure(s) in the ambulatory ([facility]) setting.  The nature of the procedure, as well as the risks, benefits, and alternatives were carefully and thoroughly reviewed with the patient. Ample time for discussion and questions allowed. The patient understood, was satisfied, and agreed to proceed.     HPI: Mathew Sanchez. is a 68 y.o. male who presents for colonoscopy for ongoing Colon Cancer screening.  No active GI symptoms.  No known family history of colon cancer or related malignancy.  Patient is otherwise without complaints or active issues today.  Last colonoscopy was 05/2013 and normal with recommendation to repeat in 10 years. Prior to that, colonoscopy in 09/2007 with erythema and granularity in the sigmoid colon (path), otherwise normal.   Holding Eliquis  for procedure today.   Past Medical History:  Diagnosis Date   ACNE NEC 07/29/2008   ALLERGIC RHINITIS 07/29/2008   Atrial fib/flutter, transient (HCC)    Chronic kidney disease    kidney stones   COLONIC POLYPS, HX OF 07/29/2008   ERECTILE DYSFUNCTION 07/29/2008   HYPERLIPIDEMIA 07/29/2008   HYPERTENSION 07/29/2008   NEPHROLITHIASIS, HX OF 07/29/2008   PSA, INCREASED 07/29/2008    Past Surgical History:  Procedure Laterality Date   ATRIAL FIBRILLATION ABLATION N/A 04/17/2023   Procedure: ATRIAL FIBRILLATION ABLATION;  Surgeon: Lei Pump, MD;  Location: [facility];  Service: Cardiovascular;  Laterality: N/A;   COLONOSCOPY     POLYPECTOMY     TONSILLECTOMY      Prior to Admission medications   Medication Sig Start Date End Date Taking? Authorizing Provider  atorvastatin  (LIPITOR) 10 MG tablet Take 1 tablet (10 mg total) by mouth daily. 08/04/23  Yes Roslyn Coombe, MD  benazepril  (LOTENSIN ) 20 MG  tablet Take 1 tablet (20 mg total) by mouth daily. 11/19/23  Yes Ollis, Trina Fujita, NP  diltiazem  (CARDIZEM  CD) 120 MG 24 hr capsule Take 1 capsule by mouth daily. 10/27/23  Yes Camnitz, Will Gaylyn Keas, MD  Na Sulfate-K Sulfate-Mg Sulfate concentrate (SUPREP BOWEL PREP KIT) 17.5-3.13-1.6 GM/177ML SOLN Take 1 kit (354 mLs total) by mouth as directed. 10/15/23  Yes Zaiyah Sottile V, DO  Na Sulfate-K Sulfate-Mg Sulfate concentrate (SUPREP) 17.5-3.13-1.6 GM/177ML SOLN Use as directed; may use generic; goodrx card if insurance will not cover generic 12/15/23  Yes Waldon Sheerin V, DO  apixaban  (ELIQUIS ) 5 MG TABS tablet Take 1 tablet (5 mg total) by mouth 2 (two) times daily. 11/20/23 05/18/24  Thomasena Fleming, NP    Current Outpatient Medications  Medication Sig Dispense Refill   atorvastatin  (LIPITOR) 10 MG tablet Take 1 tablet (10 mg total) by mouth daily. 90 tablet 3   benazepril  (LOTENSIN ) 20 MG tablet Take 1 tablet (20 mg total) by mouth daily. 90 tablet 2   diltiazem  (CARDIZEM  CD) 120 MG 24 hr capsule Take 1 capsule by mouth daily. 90 capsule 2   Na Sulfate-K Sulfate-Mg Sulfate concentrate (SUPREP BOWEL PREP KIT) 17.5-3.13-1.6 GM/177ML SOLN Take 1 kit (354 mLs total) by mouth as directed. 324 mL 0   Na Sulfate-K Sulfate-Mg Sulfate concentrate (SUPREP) 17.5-3.13-1.6 GM/177ML SOLN Use as directed; may use generic; goodrx card if insurance will not cover generic 354 mL 0   apixaban  (ELIQUIS ) 5 MG TABS tablet Take 1 tablet (5 mg total) by mouth  2 (two) times daily. 180 tablet 1   Current Facility-Administered Medications  Medication Dose Route Frequency Provider Last Rate Last Admin   0.9 %  sodium chloride  infusion  500 mL Intravenous Once Elizabet Schweppe V, DO        Allergies as of 12/22/2023   (No Known Allergies)    Family History  Problem Relation Age of Onset   Colon cancer Neg Hx    Liver disease Neg Hx    Esophageal cancer Neg Hx     Social History   Socioeconomic History   Marital  status: Married    Spouse name: Not on file   Number of children: 3   Years of education: Not on file   Highest education level: Not on file  Occupational History   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  Tobacco Use   Smoking status: Never   Smokeless tobacco: Never  Vaping Use   Vaping status: Never Used  Substance and Sexual Activity   Alcohol use: Yes    Comment: socially   Drug use: No   Sexual activity: Not on file  Other Topics Concern   Not on file  Social History Narrative   Not on file   Social Drivers of Health   Financial Resource Strain: Low Risk  (02/06/2023)   Overall Financial Resource Strain (CARDIA)    Difficulty of Paying Living Expenses: Not hard at all  Food Insecurity: No Food Insecurity (02/06/2023)   Hunger Vital Sign    Worried About Running Out of Food in the Last Year: Never true    Ran Out of Food in the Last Year: Never true  Transportation Needs: No Transportation Needs (02/06/2023)   PRAPARE - Administrator, Civil Service (Medical): No    Lack of Transportation (Non-Medical): No  Physical Activity: Insufficiently Active (02/06/2023)   Exercise Vital Sign    Days of Exercise per Week: 4 days    Minutes of Exercise per Session: 30 min  Stress: No Stress Concern Present (02/06/2023)   [employer] of Occupational [employer] - Occupational Stress Questionnaire    Feeling of Stress : Only a little  Social Connections: Unknown (02/06/2023)   Social Connection and Isolation Panel [NHANES]    Frequency of Communication with Friends and Family: More than three times a week    Frequency of Social Gatherings with Friends and Family: More than three times a week    Attends Religious Services: Not on file    Active Member of Clubs or Organizations: No    Attends Banker Meetings: 1 to 4 times per year    Marital Status: Married  Catering manager Violence: Not At Risk (02/06/2023)   Humiliation, Afraid, Rape, and Kick  questionnaire    Fear of Current or Ex-Partner: No    Emotionally Abused: No    Physically Abused: No    Sexually Abused: No    Physical Exam: Vital signs in last 24 hours: @BP  (!) 140/84   Pulse 60   Temp 98.2 F (36.8 C) (Temporal)   Ht 6\' 3"  (1.905 m)   Wt 211 lb (95.7 kg)   SpO2 96%   BMI 26.37 kg/m  GEN: NAD EYE: Sclerae anicteric ENT: MMM CV: Non-tachycardic Pulm: CTA b/l GI: Soft, NT/ND NEURO:  Alert & Oriented x 3   Mathew Lindau, DO [facility]   12/22/2023 11:09 AM

## 2023-12-22 NOTE — Op Note (Signed)
 [facility] Patient Name: Mathew Sanchez Procedure Date: 12/22/2023 11:10 AM MRN: [id number] Endoscopist: Harry Lindau , MD, [id number] Age: 68 Referring MD:  Date of Birth: [birth year] Gender: Male Account #: [id number] Procedure:                Colonoscopy Indications:              Screening for colorectal malignant neoplasm (last                            colonoscopy was 10 years ago)                           Last colonoscopy was 05/2013 and normal with                            recommendation to repeat in 10 years. Medicines:                Monitored Anesthesia Care Procedure:                Pre-Anesthesia Assessment:                           - Prior to the procedure, a History and Physical                            was performed, and patient medications and                            allergies were reviewed. The patient's tolerance of                            previous anesthesia was also reviewed. The risks                            and benefits of the procedure and the sedation                            options and risks were discussed with the patient.                            All questions were answered, and informed consent                            was obtained. Prior Anticoagulants: The patient has                            taken Eliquis  (apixaban ), last dose was 2 days                            prior to procedure. ASA Grade Assessment: II - A                            patient with mild systemic disease. After reviewing  the risks and benefits, the patient was deemed in                            satisfactory condition to undergo the procedure.                           After obtaining informed consent, the colonoscope                            was passed under direct vision. Throughout the                            procedure, the patient's blood pressure, pulse, and                            oxygen saturations were  monitored continuously. The                            Olympus Scope SN: [id number] was introduced through                            the anus and advanced to the the terminal ileum.                            The colonoscopy was performed without difficulty.                            The patient tolerated the procedure well. The                            quality of the bowel preparation was good. The                            terminal ileum, ileocecal valve, appendiceal                            orifice, and rectum were photographed. Scope In: 11:14:18 AM Scope Out: 11:36:37 AM Scope Withdrawal Time: 0 hours 18 minutes 58 seconds  Total Procedure Duration: 0 hours 22 minutes 19 seconds  Findings:                 The perianal and digital rectal examinations were                            normal.                           Five sessile polyps were found in the ascending                            colon (4) and cecum (1). The polyps were 2 to 6 mm                            in size. These polyps were removed with a  cold                            snare. Resection and retrieval were complete.                            Estimated blood loss was minimal.                           Two sessile polyps were found in the sigmoid colon.                            The polyps were 2 to 4 mm in size. These polyps                            were removed with a cold snare. Resection and                            retrieval were complete. Estimated blood loss was                            minimal.                           Non-bleeding internal hemorrhoids were found during                            retroflexion. The hemorrhoids were small and Grade                            II (internal hemorrhoids that prolapse but reduce                            spontaneously).                           The terminal ileum appeared normal. Complications:            No immediate complications. Estimated Blood  Loss:     Estimated blood loss was minimal. Impression:               - Five 2 to 6 mm polyps in the ascending colon and                            in the cecum, removed with a cold snare. Resected                            and retrieved.                           - Two 2 to 4 mm polyps in the sigmoid colon,                            removed with a cold snare. Resected and retrieved.                           -  Non-bleeding internal hemorrhoids.                           - The examined portion of the ileum was normal. Recommendation:           - Patient has a contact number available for                            emergencies. The signs and symptoms of potential                            delayed complications were discussed with the                            patient. Return to normal activities tomorrow.                            Written discharge instructions were provided to the                            patient.                           - Resume previous diet.                           - Continue present medications.                           - Await pathology results.                           - Repeat colonoscopy for surveillance based on                            pathology results.                           - Resume Eliquis  (apixaban ) at prior dose tomorrow.                           - Return to GI clinic PRN. Harry Lindau, MD 12/22/2023 11:41:48 AM

## 2023-12-22 NOTE — Patient Instructions (Signed)
 Resume previous diet Continue present medications Await pathology results Resume Eliquis  at prior dose tomorrow    YOU HAD AN ENDOSCOPIC PROCEDURE TODAY AT THE [facility]:   Refer to the procedure report that was given to you for any specific questions about what was found during the examination.  If the procedure report does not answer your questions, please call your gastroenterologist to clarify.  If you requested that your care partner not be given the details of your procedure findings, then the procedure report has been included in a sealed envelope for you to review at your convenience later.  YOU SHOULD EXPECT: Some feelings of bloating in the abdomen. Passage of more gas than usual.  Walking can help get rid of the air that was put into your GI tract during the procedure and reduce the bloating. If you had a lower endoscopy (such as a colonoscopy or flexible sigmoidoscopy) you may notice spotting of blood in your stool or on the toilet paper. If you underwent a bowel prep for your procedure, you may not have a normal bowel movement for a few days.  Please Note:  You might notice some irritation and congestion in your nose or some drainage.  This is from the oxygen used during your procedure.  There is no need for concern and it should clear up in a day or so.  SYMPTOMS TO REPORT IMMEDIATELY:  Following lower endoscopy (colonoscopy or flexible sigmoidoscopy):  Excessive amounts of blood in the stool  Significant tenderness or worsening of abdominal pains  Swelling of the abdomen that is new, acute  Fever of 100F or higher   For urgent or emergent issues, a gastroenterologist can be reached at any hour by calling [phone]. Do not use MyChart messaging for urgent concerns.    DIET:  We do recommend a small meal at first, but then you may proceed to your regular diet.  Drink plenty of fluids but you should avoid alcoholic beverages for 24 hours.  ACTIVITY:  You  should plan to take it easy for the rest of today and you should NOT DRIVE or use heavy machinery until tomorrow (because of the sedation medicines used during the test).    FOLLOW UP: Our staff will call the number listed on your records the next business day following your procedure.  We will call around 7:15- 8:00 am to check on you and address any questions or concerns that you may have regarding the information given to you following your procedure. If we do not reach you, we will leave a message.     If any biopsies were taken you will be contacted by phone or by letter within the next 1-3 weeks.  Please call us  at [phone] if you have not heard about the biopsies in 3 weeks.    SIGNATURES/CONFIDENTIALITY: You and/or your care partner have signed paperwork which will be entered into your electronic medical record.  These signatures attest to the fact that that the information above on your After Visit Summary has been reviewed and is understood.  Full responsibility of the confidentiality of this discharge information lies with you and/or your care-partner.

## 2023-12-22 NOTE — Progress Notes (Signed)
 1125 Robinul 0.2 mg IV given due large amount of secretions upon assessment.  Patient experiencing nausea and retching.  MD updated and Zofran  4 mg IV given.  Pt experienced laryngeal spasm with jaw thrust  performed. vss

## 2023-12-22 NOTE — Progress Notes (Signed)
 Called to room to assist during endoscopic procedure.  Patient ID and intended procedure confirmed with present staff. Received instructions for my participation in the procedure from the performing physician.

## 2023-12-23 ENCOUNTER — Telehealth: Payer: Self-pay

## 2023-12-23 NOTE — Telephone Encounter (Signed)
 No answer after follow up call. Voice message left.

## 2023-12-24 ENCOUNTER — Ambulatory Visit: Payer: Self-pay | Admitting: Gastroenterology

## 2023-12-24 LAB — SURGICAL PATHOLOGY

## 2024-02-05 ENCOUNTER — Telehealth: Payer: Self-pay

## 2024-02-05 ENCOUNTER — Other Ambulatory Visit (HOSPITAL_COMMUNITY): Payer: Self-pay

## 2024-02-05 NOTE — Telephone Encounter (Signed)
 Pharmacy Patient Advocate Encounter   Received notification from CoverMyMeds that prior authorization for Repatha  Sureclick is required/requested.   Insurance verification completed.   The patient is insured through [facility] .   Per test claim: PA required; PA submitted to above mentioned insurance via CoverMyMeds Key/confirmation #/EOC [facility] Status is pending

## 2024-02-06 ENCOUNTER — Other Ambulatory Visit (HOSPITAL_COMMUNITY): Payer: Self-pay

## 2024-02-06 NOTE — Telephone Encounter (Signed)
 Pharmacy Patient Advocate Encounter  Received notification from [facility] that Prior Authorization for Repatha  Sureclick 140 has been APPROVED from 02/05/24 to 02/04/25. Unable to obtain price due to refill too soon rejection, last fill date 02/06/24 next available fill date 04/09/24   PA #/Case ID/Reference #: [id number]

## 2024-02-12 NOTE — Telephone Encounter (Signed)
 Called and spoke with patient. He informed me that when last having the repatha  ran it was still going to be about $2000. Patient will confirm with pharmacy now that PA was approved to see if price had lessened any

## 2024-02-12 NOTE — Telephone Encounter (Signed)
 Attempted to call patient but had to leave a voice message for call back. Wanted to inform him of med approval but not currently having a price for it

## 2024-04-13 ENCOUNTER — Other Ambulatory Visit: Payer: Self-pay | Admitting: Pulmonary Disease

## 2024-04-13 DIAGNOSIS — I480 Paroxysmal atrial fibrillation: Secondary | ICD-10-CM

## 2024-04-20 DIAGNOSIS — I480 Paroxysmal atrial fibrillation: Secondary | ICD-10-CM

## 2024-04-20 MED ORDER — APIXABAN 5 MG PO TABS: 5.0000 mg | ORAL_TABLET | Freq: Two times a day (BID) | ORAL | 1 refills | Status: DC

## 2024-05-14 DIAGNOSIS — I10 Essential (primary) hypertension: Secondary | ICD-10-CM | POA: Diagnosis not present

## 2024-06-16 ENCOUNTER — Other Ambulatory Visit

## 2024-06-17 ENCOUNTER — Other Ambulatory Visit (INDEPENDENT_AMBULATORY_CARE_PROVIDER_SITE_OTHER)

## 2024-06-17 DIAGNOSIS — E785 Hyperlipidemia, unspecified: Secondary | ICD-10-CM | POA: Diagnosis not present

## 2024-06-17 DIAGNOSIS — E559 Vitamin D deficiency, unspecified: Secondary | ICD-10-CM

## 2024-06-17 DIAGNOSIS — E7800 Pure hypercholesterolemia, unspecified: Secondary | ICD-10-CM

## 2024-06-17 DIAGNOSIS — R739 Hyperglycemia, unspecified: Secondary | ICD-10-CM | POA: Diagnosis not present

## 2024-06-17 DIAGNOSIS — Z125 Encounter for screening for malignant neoplasm of prostate: Secondary | ICD-10-CM

## 2024-06-17 DIAGNOSIS — E538 Deficiency of other specified B group vitamins: Secondary | ICD-10-CM

## 2024-06-17 LAB — BASIC METABOLIC PANEL WITH GFR
BUN: 20 mg/dL (ref 6–23)
CO2: 29 meq/L (ref 19–32)
Calcium: 9.1 mg/dL (ref 8.4–10.5)
Chloride: 104 meq/L (ref 96–112)
Creatinine, Ser: 1.21 mg/dL (ref 0.40–1.50)
GFR: 61.93 mL/min (ref >60.00)
Glucose, Bld: 89 mg/dL (ref 70–99)
Potassium: 4.4 meq/L (ref 3.5–5.1)
Sodium: 140 meq/L (ref 135–145)

## 2024-06-17 LAB — CBC WITH DIFFERENTIAL/PLATELET
Basophils Absolute: 0 K/uL (ref 0.0–0.1)
Basophils Relative: 0.9 % (ref 0.0–3.0)
Eosinophils Absolute: 0.1 K/uL (ref 0.0–0.7)
Eosinophils Relative: 2.2 % (ref 0.0–5.0)
HCT: 43.3 % (ref 39.0–52.0)
Hemoglobin: 14.3 g/dL (ref 13.0–17.0)
Lymphocytes Relative: 26.3 % (ref 12.0–46.0)
Lymphs Abs: 1.3 K/uL (ref 0.7–4.0)
MCHC: 33.1 g/dL (ref 30.0–36.0)
MCV: 96.8 fl (ref 78.0–100.0)
Monocytes Absolute: 0.5 K/uL (ref 0.1–1.0)
Monocytes Relative: 9.7 % (ref 3.0–12.0)
Neutro Abs: 3 K/uL (ref 1.4–7.7)
Neutrophils Relative %: 60.9 % (ref 43.0–77.0)
Platelets: 183 K/uL (ref 150.0–400.0)
RBC: 4.47 Mil/uL (ref 4.22–5.81)
RDW: 13.6 % (ref 11.5–15.5)
WBC: 5 K/uL (ref 4.0–10.5)

## 2024-06-17 LAB — URINALYSIS, ROUTINE W REFLEX MICROSCOPIC
Bilirubin Urine: NEGATIVE
Ketones, ur: NEGATIVE
Leukocytes,Ua: NEGATIVE
Nitrite: NEGATIVE
Specific Gravity, Urine: 1.02 (ref 1.000–1.030)
Total Protein, Urine: NEGATIVE
Urine Glucose: NEGATIVE
Urobilinogen, UA: 0.2 (ref 0.0–1.0)
pH: 6 (ref 5.0–8.0)

## 2024-06-17 LAB — HEPATIC FUNCTION PANEL
ALT: 17 U/L (ref 0–53)
AST: 18 U/L (ref 0–37)
Albumin: 4.2 g/dL (ref 3.5–5.2)
Alkaline Phosphatase: 67 U/L (ref 39–117)
Bilirubin, Direct: 0.2 mg/dL (ref 0.0–0.3)
Total Bilirubin: 1 mg/dL (ref 0.2–1.2)
Total Protein: 6.2 g/dL (ref 6.0–8.3)

## 2024-06-17 LAB — LIPID PANEL
Cholesterol: 137 mg/dL (ref 0–200)
HDL: 42 mg/dL (ref >39.00)
LDL Cholesterol: 80 mg/dL (ref 0–99)
NonHDL: 95.24
Total CHOL/HDL Ratio: 3
Triglycerides: 76 mg/dL (ref 0.0–149.0)
VLDL: 15.2 mg/dL (ref 0.0–40.0)

## 2024-06-17 LAB — VITAMIN B12: Vitamin B-12: 250 pg/mL (ref 211–911)

## 2024-06-17 LAB — HEMOGLOBIN A1C: Hgb A1c MFr Bld: 5.2 % (ref 4.6–6.5)

## 2024-06-17 LAB — TSH: TSH: 2.16 u[IU]/mL (ref 0.35–5.50)

## 2024-06-17 LAB — VITAMIN D 25 HYDROXY (VIT D DEFICIENCY, FRACTURES): VITD: 15.97 ng/mL — ABNORMAL LOW (ref 30.00–100.00)

## 2024-06-17 LAB — PSA: PSA: 2.81 ng/mL (ref 0.10–4.00)

## 2024-06-22 ENCOUNTER — Encounter: Payer: Self-pay | Admitting: Internal Medicine

## 2024-06-26 ENCOUNTER — Other Ambulatory Visit: Payer: Self-pay | Admitting: Internal Medicine

## 2024-06-26 ENCOUNTER — Other Ambulatory Visit: Payer: Self-pay | Admitting: Pulmonary Disease

## 2024-06-27 ENCOUNTER — Other Ambulatory Visit: Payer: Self-pay | Admitting: Cardiology

## 2024-06-28 ENCOUNTER — Other Ambulatory Visit: Payer: Self-pay

## 2024-06-29 ENCOUNTER — Ambulatory Visit: Admitting: Internal Medicine

## 2024-06-29 ENCOUNTER — Encounter: Payer: Self-pay | Admitting: Internal Medicine

## 2024-06-29 VITALS — BP 120/76 | HR 82 | Temp 98.6°F | Ht 75.0 in | Wt 203.0 lb

## 2024-06-29 DIAGNOSIS — I480 Paroxysmal atrial fibrillation: Secondary | ICD-10-CM

## 2024-06-29 DIAGNOSIS — E538 Deficiency of other specified B group vitamins: Secondary | ICD-10-CM | POA: Diagnosis not present

## 2024-06-29 DIAGNOSIS — Z0000 Encounter for general adult medical examination without abnormal findings: Secondary | ICD-10-CM | POA: Diagnosis not present

## 2024-06-29 DIAGNOSIS — E7800 Pure hypercholesterolemia, unspecified: Secondary | ICD-10-CM

## 2024-06-29 DIAGNOSIS — R058 Other specified cough: Secondary | ICD-10-CM

## 2024-06-29 DIAGNOSIS — I10 Essential (primary) hypertension: Secondary | ICD-10-CM

## 2024-06-29 DIAGNOSIS — R972 Elevated prostate specific antigen [PSA]: Secondary | ICD-10-CM

## 2024-06-29 DIAGNOSIS — N1831 Chronic kidney disease, stage 3a: Secondary | ICD-10-CM

## 2024-06-29 DIAGNOSIS — G4719 Other hypersomnia: Secondary | ICD-10-CM | POA: Insufficient documentation

## 2024-06-29 DIAGNOSIS — Z0001 Encounter for general adult medical examination with abnormal findings: Secondary | ICD-10-CM

## 2024-06-29 DIAGNOSIS — E559 Vitamin D deficiency, unspecified: Secondary | ICD-10-CM

## 2024-06-29 MED ORDER — REPATHA SURECLICK 140 MG/ML ~~LOC~~ SOAJ: 140.0000 mg | SUBCUTANEOUS | 3 refills | Status: AC

## 2024-06-29 MED ORDER — APIXABAN 5 MG PO TABS: 5.0000 mg | ORAL_TABLET | Freq: Two times a day (BID) | ORAL | 3 refills | Status: AC

## 2024-06-29 MED ORDER — DILTIAZEM HCL ER COATED BEADS 120 MG PO CP24: 120.0000 mg | ORAL_CAPSULE | Freq: Every day | ORAL | 3 refills | Status: AC

## 2024-06-29 MED ORDER — AZITHROMYCIN 250 MG PO TABS: ORAL_TABLET | ORAL | 1 refills | Status: AC

## 2024-06-29 MED ORDER — BENAZEPRIL HCL 20 MG PO TABS: 20.0000 mg | ORAL_TABLET | Freq: Every day | ORAL | 3 refills | Status: AC

## 2024-06-29 NOTE — Assessment & Plan Note (Signed)
 Last vitamin D  Lab Results  Component Value Date   VD25OH 15.97 (L) 06/17/2024   Low, to start oral replacement

## 2024-06-29 NOTE — Assessment & Plan Note (Signed)
 Lab Results  Component Value Date   LDLCALC 80 06/17/2024   Uncontrolled, possible myalgias with statin, pt to change lipitor to Repatha  140 mg biweekly

## 2024-06-29 NOTE — Assessment & Plan Note (Signed)
 Lab Results  Component Value Date   CREATININE 1.21 06/17/2024   Stable overall, cont to avoid nephrotoxins ckd3a

## 2024-06-29 NOTE — Assessment & Plan Note (Signed)
 Lab Results  Component Value Date   PSA 2.81 06/17/2024   PSA 2.08 04/30/2023   PSA 1.88 04/25/2022   With possible increased velocity, for f/u psa at 6 months

## 2024-06-29 NOTE — Assessment & Plan Note (Signed)
 Lab Results  Component Value Date   VITAMINB12 250 06/17/2024   Low, to start oral replacement - b12 1000 mcg every day x 6 mo

## 2024-06-29 NOTE — Assessment & Plan Note (Signed)
.  Age and sex appropriate education and counseling updated with regular exercise and diet Referrals for preventative services - none needed Immunizations addressed -  for tdap and prevnar at pharmacy Smoking counseling  - none needed Evidence for depression or other mood disorder - none significant Most recent labs reviewed. I have personally reviewed and have noted: 1) the patient's medical and social history 2) The patient's current medications and supplements 3) The patient's height, weight, and BMI have been recorded in the chart

## 2024-06-29 NOTE — Progress Notes (Signed)
 Patient ID: Mathew Sanchez., male   DOB: 1955-11-02, 68 y.o.   MRN: [id number]         Chief Complaint:: wellness exam and low b12, low vit d, productive cough, hld, increased psa velocity, PAF       HPI:  [name]. is a 68 y.o. male here for wellness exam; for tdap and prevnar 20 at the pharmacy, o/w up to date                Also Here with acute onset mild to mod 2-3 wks ST, HA, general weakness and malaise, with prod cough greenish sputum, but Pt denies chest pain, increased sob or doe, wheezing, orthopnea, PND, increased LE swelling, palpitations, dizziness or syncope.  Pt with myalgias possible statin related, willing to change to repatha .  Denies urinary symptoms such as dysuria, frequency, urgency, flank pain, hematuria or n/v, fever, chills.    Wt Readings from Last 3 Encounters:  06/29/24 203 lb (92.1 kg)  12/22/23 211 lb (95.7 kg)  09/26/23 211 lb (95.7 kg)   BP Readings from Last 3 Encounters:  06/29/24 120/76  12/22/23 133/79  09/26/23 130/80   Immunization History  Administered Date(s) Administered   Fluad Quad(high Dose 65+) 03/30/2023   Influenza Inj Mdck Quad With Preservative 05/14/2017   Influenza Split 05/03/2013   Influenza,inj,Quad PF,6+ Mos 04/15/2019   Influenza-Unspecified 04/01/2022, 04/02/2022   PFIZER(Purple Top)SARS-COV-2 Vaccination 09/20/2019, 10/25/2019, 05/22/2020   Td 09/18/2009   Zoster Recombinant(Shingrix ) 08/03/2019, 10/04/2019   Health Maintenance Due  Topic Date Due   Pneumococcal Vaccine: 50+ Years (1 of 2 - PCV) Never done   DTaP/Tdap/Td (2 - Tdap) 09/19/2019      Past Medical History:  Diagnosis Date   ACNE NEC 07/29/2008   ALLERGIC RHINITIS 07/29/2008   Atrial fib/flutter, transient (HCC)    Chronic kidney disease    kidney stones   COLONIC POLYPS, HX OF 07/29/2008   ERECTILE DYSFUNCTION 07/29/2008   HYPERLIPIDEMIA 07/29/2008   HYPERTENSION 07/29/2008   NEPHROLITHIASIS, HX OF 07/29/2008   PSA, INCREASED  07/29/2008   Past Surgical History:  Procedure Laterality Date   ATRIAL FIBRILLATION ABLATION N/A 04/17/2023   Procedure: ATRIAL FIBRILLATION ABLATION;  Surgeon: Inocencio Soyla Lunger, MD;  Location: [facility];  Service: Cardiovascular;  Laterality: N/A;   COLONOSCOPY     POLYPECTOMY     TONSILLECTOMY      reports that he has never smoked. He has never used smokeless tobacco. He reports current alcohol use. He reports that he does not use drugs. family history is not on file. Allergies[1] Medications Ordered Prior to Encounter[2]      ROS:  All others reviewed and negative.  Objective        PE:  BP 120/76 (BP Location: Right Arm, Patient Position: Sitting, Cuff Size: Normal)   Pulse 82   Temp 98.6 F (37 C) (Oral)   Ht 6' 3 (1.905 m)   Wt 203 lb (92.1 kg)   SpO2 95%   BMI 25.37 kg/m                 Constitutional: Pt appears mild ill               HENT: Head: NCAT.                Right Ear: External ear normal.                 Left Ear:  External ear normal. Bilat tm's with mild erythema.  Max sinus areas non tender.  Pharynx with mild erythema, no exudate               Eyes: . Pupils are equal, round, and reactive to light. Conjunctivae and EOM are normal               Nose: without d/c or deformity               Neck: Neck supple. Gross normal ROM               Cardiovascular: Normal rate and regular rhythm.                 Pulmonary/Chest: Effort normal and breath sounds without rales or wheezing.                Abd:  Soft, NT, ND, + BS, no organomegaly               Neurological: Pt is alert. At baseline orientation, motor grossly intact               Skin: Skin is warm. No rashes, no other new lesions, LE edema - none               Psychiatric: Pt behavior is normal without agitation   Micro: none  Cardiac tracings I have personally interpreted today:  none  Pertinent Radiological findings (summarize): none   Lab Results  Component Value Date   WBC 5.0  06/17/2024   HGB 14.3 06/17/2024   HCT 43.3 06/17/2024   PLT 183.0 06/17/2024   GLUCOSE 89 06/17/2024   CHOL 137 06/17/2024   TRIG 76.0 06/17/2024   HDL 42.00 06/17/2024   LDLDIRECT 144.7 10/09/2010   LDLCALC 80 06/17/2024   ALT 17 06/17/2024   AST 18 06/17/2024   NA 140 06/17/2024   K 4.4 06/17/2024   CL 104 06/17/2024   CREATININE 1.21 06/17/2024   BUN 20 06/17/2024   CO2 29 06/17/2024   TSH 2.16 06/17/2024   PSA 2.81 06/17/2024   INR 1.1 12/04/2022   HGBA1C 5.2 06/17/2024   Assessment/Plan:  [name]. is a 68 y.o. White or Caucasian [1] male with  has a past medical history of ACNE NEC (07/29/2008), ALLERGIC RHINITIS (07/29/2008), Atrial fib/flutter, transient (HCC), Chronic kidney disease, COLONIC POLYPS, HX OF (07/29/2008), ERECTILE DYSFUNCTION (07/29/2008), HYPERLIPIDEMIA (07/29/2008), HYPERTENSION (07/29/2008), NEPHROLITHIASIS, HX OF (07/29/2008), and PSA, INCREASED (07/29/2008).  Paroxysmal atrial fibrillation (HCC) Stable rate and volume, cont current med tx - eliquis  5 bid  Encounter for well adult exam with abnormal findings Age and sex appropriate education and counseling updated with regular exercise and diet Referrals for preventative services - none needed Immunizations addressed - for tdap and prevnar at pharmacy Smoking counseling  - none needed Evidence for depression or other mood disorder - none significant Most recent labs reviewed. I have personally reviewed and have noted: 1) the patient's medical and social history 2) The patient's current medications and supplements 3) The patient's height, weight, and BMI have been recorded in the chart   Vitamin D  deficiency Last vitamin D  Lab Results  Component Value Date   VD25OH 15.97 (L) 06/17/2024   Low, to start oral replacement   HLD (hyperlipidemia) Lab Results  Component Value Date   LDLCALC 80 06/17/2024   Uncontrolled, possible myalgias with statin, pt to change lipitor to  Repatha  140 mg biweekly   Essential  hypertension BP Readings from Last 3 Encounters:  06/29/24 120/76  12/22/23 133/79  09/26/23 130/80   Stable, pt to continue medical treatment lotensin  20 every day, card CD 120 mg qd   CKD (chronic kidney disease) stage 3, GFR 30-59 ml/min (HCC) Lab Results  Component Value Date   CREATININE 1.21 06/17/2024   Stable overall, cont to avoid nephrotoxins ckd3a  Increased prostate specific antigen (PSA) velocity Lab Results  Component Value Date   PSA 2.81 06/17/2024   PSA 2.08 04/30/2023   PSA 1.88 04/25/2022   With possible increased velocity, for f/u psa at 6 months  Productive cough Mild to mod, c/w bronchitis vs pna, declines cxr, for antibx course zpack, cough med prn,  to f/u any worsening symptoms or concerns  B12 deficiency Lab Results  Component Value Date   VITAMINB12 250 06/17/2024   Low, to start oral replacement - b12 1000 mcg every day x 6 mo  Followup: Return in about 1 year (around 06/29/2025).  Lynwood Rush, MD 06/29/2024 8:52 PM [facility]     [1] No Known Allergies [2]  Current Outpatient Medications on File Prior to Visit  Medication Sig Dispense Refill   Na Sulfate-K Sulfate-Mg Sulfate concentrate (SUPREP BOWEL PREP KIT) 17.5-3.13-1.6 GM/177ML SOLN Take 1 kit (354 mLs total) by mouth as directed. 324 mL 0   Na Sulfate-K Sulfate-Mg Sulfate concentrate (SUPREP) 17.5-3.13-1.6 GM/177ML SOLN Use as directed; may use generic; goodrx card if insurance will not cover generic 354 mL 0   No current facility-administered medications on file prior to visit.

## 2024-06-29 NOTE — Assessment & Plan Note (Addendum)
 Stable rate and volume, cont current med tx - eliquis  5 bid

## 2024-06-29 NOTE — Patient Instructions (Signed)
 Please take OTC Vitamin D3 at 2000 units per day, indefinitely, as well as OTC B12 1000 mcg per day for 6 months only  Ok to change the Lipitor to Repatha  if ok with the insurance  Please take all new medication as prescribed - the antibiotic to [facility]  Please continue all other medications as before, and refills have been done if requested.  Please have the pharmacy call with any other refills you may need.  Please continue your efforts at being more active, low cholesterol diet, and weight control.  You are otherwise up to date with prevention measures today.  Please keep your appointments with your specialists as you may have planned  Please remember to have the PSA lab test done in approximately 6 months at your convenience  Please make an Appointment to return for your 1 year visit, or sooner if needed

## 2024-06-29 NOTE — Assessment & Plan Note (Signed)
 BP Readings from Last 3 Encounters:  06/29/24 120/76  12/22/23 133/79  09/26/23 130/80   Stable, pt to continue medical treatment lotensin  20 every day, card CD 120 mg qd

## 2024-06-29 NOTE — Assessment & Plan Note (Signed)
 Mild to mod, c/w  bronchitis vs pna, declines cxr,  for antibx course zpack, cough med prn,  to f/u any worsening symptoms or concerns

## 2024-06-30 ENCOUNTER — Encounter: Payer: Self-pay | Admitting: Internal Medicine

## 2024-06-30 MED ORDER — AZITHROMYCIN 250 MG PO TABS: ORAL_TABLET | ORAL | 1 refills | Status: AC
